# Patient Record
Sex: Male | Born: 1970 | Race: Black or African American | Hispanic: No | Marital: Single | Smoking: Former smoker
Health system: Southern US, Community
[De-identification: ages and names within clinical notes are randomized; demographics above are authoritative.]

## PROBLEM LIST (undated history)

## (undated) DIAGNOSIS — I4891 Unspecified atrial fibrillation: Secondary | ICD-10-CM

## (undated) DIAGNOSIS — R748 Abnormal levels of other serum enzymes: Secondary | ICD-10-CM

## (undated) DIAGNOSIS — F1010 Alcohol abuse, uncomplicated: Secondary | ICD-10-CM

## (undated) DIAGNOSIS — I10 Essential (primary) hypertension: Secondary | ICD-10-CM

## (undated) HISTORY — DX: Essential (primary) hypertension: I10

## (undated) HISTORY — DX: Alcohol abuse, uncomplicated: F10.10

## (undated) HISTORY — PX: SPINE SURGERY: SHX786

## (undated) HISTORY — DX: Abnormal levels of other serum enzymes: R74.8

---

## 2003-02-23 ENCOUNTER — Encounter: Payer: Self-pay | Admitting: Emergency Medicine

## 2003-02-23 ENCOUNTER — Emergency Department (HOSPITAL_COMMUNITY): Admission: EM | Admit: 2003-02-23 | Discharge: 2003-02-23 | Payer: Self-pay | Admitting: *Deleted

## 2013-04-20 ENCOUNTER — Encounter: Payer: Self-pay | Admitting: Physician Assistant

## 2013-04-20 ENCOUNTER — Ambulatory Visit (INDEPENDENT_AMBULATORY_CARE_PROVIDER_SITE_OTHER): Payer: Managed Care, Other (non HMO) | Admitting: Physician Assistant

## 2013-04-20 VITALS — BP 144/94 | HR 88 | Temp 98.5°F | Resp 18 | Ht 70.25 in | Wt 280.0 lb

## 2013-04-20 DIAGNOSIS — I10 Essential (primary) hypertension: Secondary | ICD-10-CM | POA: Insufficient documentation

## 2013-04-20 MED ORDER — BENAZEPRIL HCL 10 MG PO TABS: 10.0000 mg | ORAL_TABLET | Freq: Every day | ORAL | Status: DC

## 2013-04-21 NOTE — Progress Notes (Signed)
   Patient ID: Barry Harris MRN: [id number], DOB: 18-Jul-1971, 42 y.o. Date of Encounter: 04/21/2013, 1:09 PM    Chief Complaint:  Chief Complaint  Patient presents with  . new pt est care  HTN     HPI: 42 y.o. year old AA male presents to establish care at this practice. His father, who passed away with metastaic cancer in the past year, was a patient here. Pt had been going to [facility] at [place]. His LOV there was about 2 years ago. He had been started on BP med-metoprolol 25mg . He has been out of med for > 1 year.  He wants to recheck his BP and start BP med if needed. Is interested in having CPE in future. Has no complaints today. Has been feeling good.   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     Home Meds: See attached medication section for any medications that were entered at today's visit. The computer does not put those onto this list.The following list is a list of meds entered prior to today's visit.   No current outpatient prescriptions on file prior to visit.   No current facility-administered medications on file prior to visit.    Allergies: No Known Allergies    Review of Systems: See HPI for pertinent ROS. All other ROS negative.    Physical Exam: Blood pressure 144/94, pulse 88, temperature 98.5 F (36.9 C), temperature source Oral, resp. rate 18, height 5' 10.25" (1.784 m), weight 280 lb (127.007 kg)., Body mass index is 39.91 kg/(m^2). General:  Overweight AAM. Very Pleasant. Appears in no acute distress. Neck: Supple. No thyromegaly. No lymphadenopathy.No carotid bruit. Lungs: Clear bilaterally to auscultation without wheezes, rales, or rhonchi. Breathing is unlabored. Heart: Regular rhythm. No murmurs, rubs, or gallops. Msk:  Strength and tone normal for age. Extremities/Skin: Warm and dry. No edema. Neuro: Alert and oriented X 3. Moves all extremities spontaneously. Gait is normal. CNII-XII grossly in tact. Psych:  Responds to  questions appropriately with a normal affect.     ASSESSMENT AND PLAN:  42 y.o. year old male with  1. Hypertension Pt says he did have fatigue with metoprolol. Will use ACE Inh instead. Will start med. Will sched f/u OV in about 2 weeks. Will schedule it as CPE in early a.m. So he can come fasting. Can recheck BP and check labs while he is here for that one visit. He say she can go into work late.  - benazepril (LOTENSIN) 10 MG tablet; Take 1 tablet (10 mg total) by mouth daily.  Dispense: 30 tablet; Refill: 0   Signed, [address] [place], [place], [facility] 04/21/2013 1:09 PM

## 2013-05-08 ENCOUNTER — Ambulatory Visit (INDEPENDENT_AMBULATORY_CARE_PROVIDER_SITE_OTHER): Payer: Managed Care, Other (non HMO) | Admitting: Physician Assistant

## 2013-05-08 ENCOUNTER — Encounter: Payer: Self-pay | Admitting: Physician Assistant

## 2013-05-08 VITALS — BP 154/100 | HR 100 | Temp 98.8°F | Resp 20 | Ht 70.5 in | Wt 276.0 lb

## 2013-05-08 DIAGNOSIS — I10 Essential (primary) hypertension: Secondary | ICD-10-CM

## 2013-05-08 DIAGNOSIS — Z0000 Encounter for general adult medical examination without abnormal findings: Secondary | ICD-10-CM

## 2013-05-08 DIAGNOSIS — Z125 Encounter for screening for malignant neoplasm of prostate: Secondary | ICD-10-CM

## 2013-05-08 LAB — COMPLETE METABOLIC PANEL WITH GFR
Albumin: 4.5 g/dL (ref 3.5–5.2)
BUN: 17 mg/dL (ref 6–23)
CO2: 24 mEq/L (ref 19–32)
Calcium: 9.9 mg/dL (ref 8.4–10.5)
Chloride: 100 mEq/L (ref 96–112)
GFR, Est African American: >89 mL/min
GFR, Est Non African American: >89 mL/min
Glucose, Bld: 101 mg/dL — ABNORMAL HIGH (ref 70–99)
Potassium: 4.3 mEq/L (ref 3.5–5.3)

## 2013-05-08 LAB — CBC WITH DIFFERENTIAL/PLATELET
Basophils Relative: 1 % (ref 0–1)
Hemoglobin: 15.9 g/dL (ref 13.0–17.0)
Lymphocytes Relative: 23 % (ref 12–46)
MCHC: 35.3 g/dL (ref 30.0–36.0)
Monocytes Relative: 8 % (ref 3–12)
Neutro Abs: 4.3 10*3/uL (ref 1.7–7.7)
Neutrophils Relative %: 66 % (ref 43–77)
RBC: 5.58 MIL/uL (ref 4.22–5.81)
WBC: 6.5 10*3/uL (ref 4.0–10.5)

## 2013-05-08 LAB — TSH: TSH: 2.061 u[IU]/mL (ref 0.350–4.500)

## 2013-05-08 LAB — LIPID PANEL
Cholesterol: 202 mg/dL — ABNORMAL HIGH (ref 0–200)
VLDL: 58 mg/dL — ABNORMAL HIGH (ref 0–40)

## 2013-05-08 MED ORDER — BENAZEPRIL HCL 20 MG PO TABS: 20.0000 mg | ORAL_TABLET | Freq: Every day | ORAL | Status: DC

## 2013-05-08 MED ORDER — HYDROCHLOROTHIAZIDE 25 MG PO TABS: 25.0000 mg | ORAL_TABLET | Freq: Every day | ORAL | Status: DC

## 2013-05-08 NOTE — Progress Notes (Signed)
Patient ID: Barry Harris MRN: [id number], DOB: [birth year] 42 y.o. Date of Encounter: 05/08/2013, 7:33 PM    Chief Complaint: Physical (CPE)  HPI: 42 y.o. y/o AA male here for CPE. He was seen by me for initial OV to establish care at our office on 04/21/13. His father, Barry Harris, passed away this past year secondary to metastatic cancer-was a  pt here.  When Barry Harris came 04/21/13 he reported h/o HTN but reported he had stopped his medication > 1 year ago. His LOV with his prior PCP was 2 years ago.  At OV 04/21/13 BP was 144/94.  He reported he had experienced fatigue with metoprolol in past. So, I started Benazepril 10mg  instead. He has been taking this daily and has taken dose this morning. He has had no adv effects.   He has no complaints.   He says he weighed 292 lb January 2014. He and friends at work made new years resolution. He has made diet changes and has exercised some-has lost to 276 lb today.    Review of Systems: Consitutional: No fever, chills, fatigue, night sweats, lymphadenopathy, or weight changes. Eyes: No visual changes, eye redness, or discharge. ENT/Mouth: Ears: No otalgia, tinnitus, hearing loss, discharge. Nose: No congestion, rhinorrhea, sinus pain, or epistaxis. Throat: No sore throat, post nasal drip, or teeth pain. Cardiovascular: No CP, palpitations, diaphoresis, DOE, edema, orthopnea, PND. Respiratory: No cough, hemoptysis, SOB, or wheezing. Gastrointestinal: No anorexia, dysphagia, reflux, pain, nausea, vomiting, hematemesis, diarrhea, constipation, BRBPR, or melena. Genitourinary: No dysuria, frequency, urgency, hematuria, incontinence, nocturia, decreased urinary stream, discharge, impotence, or testicular pain/masses. Musculoskeletal: No decreased ROM, myalgias, stiffness, joint swelling, or weakness. Skin: No rash, erythema, lesion changes, pain, warmth, jaundice, or pruritis. Neurological: No headache, dizziness, syncope, seizures,  tremors, memory loss, coordination problems, or paresthesias. Psychological: No anxiety, depression, hallucinations, SI/HI. Endocrine: No fatigue, polydipsia, polyphagia, polyuria, or known diabetes. All other systems were reviewed and are otherwise negative.  Past Medical History  Diagnosis Date  . Hypertension      Past Surgical History  Procedure Laterality Date  . Spine surgery      42yo    Home Meds:  Current Outpatient Prescriptions on File Prior to Visit  Medication Sig Dispense Refill  . Multiple Vitamin (MULTIVITAMIN) tablet Take 1 tablet by mouth daily.       No current facility-administered medications on file prior to visit.    Allergies: No Known Allergies  History   Social History  . Marital Status: Single    Spouse Name: N/A    Number of Children: N/A  . Years of Education: N/A   Occupational History  . [employer]   Social History Main Topics  . Smoking status: Former Smoker    Quit date: 04/20/2001  . Smokeless tobacco: Never Used  . Alcohol Use: Not on file  . Drug Use: No  . Sexually Active: Not Currently   Other Topics Concern  . 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       Exercise: Lifts weights some. Rides stationary bike 2-3 times per week -but does this in spurts.                    Does not consistently exercise and stay on routine.      Diet: Fairly careful with what he eats.  Family History  Problem Relation Age of Onset  . Cancer Father     prostate  . Asthma Sister   . Diabetes Sister   . Diabetes Maternal Grandmother   . Heart disease Maternal Grandmother   . Diabetes Mother   . Hypertension Mother     Physical Exam: Blood pressure 154/100, pulse 100, temperature 98.8 F (37.1 C), temperature source Oral, resp. rate 20, height 5' 10.5" (1.791 m), weight 276 lb (125.193 kg).  BP by Me: 170/110 loud and clear on  left. General: Overweight AAM. Appears in no acute distress. HEENT: Normocephalic, atraumatic. Conjunctiva pink, sclera non-icteric. Pupils 2 mm constricting to 1 mm, round, regular, and equally reactive to light and accomodation. EOMI. Internal auditory canal clear. TMs with good cone of light and without pathology. Nasal mucosa pink. Nares are without discharge. No sinus tenderness. Oral mucosa pink. Pharynx without exudate.   Neck: Supple. Trachea midline. No thyromegaly. Full ROM. No lymphadenopathy.No carotid Bruits.  Lungs: Clear to auscultation bilaterally without wheezes, rales, or rhonchi. Breathing is of normal effort and unlabored. Cardiovascular: RRR with S1 S2. No murmurs, rubs, or gallops. Distal pulses 2+ symmetrically. No carotid or abdominal bruits. No Renal Artery Bruits. Abdomen: Soft, non-tender, non-distended with normoactive bowel sounds. No hepatosplenomegaly or masses. No rebound/guarding. No CVA tenderness. No hernias. Rectal: No external hemorrhoids or fissures. Rectal vault without masses. Prostate gland firm and smooth. No nodularity, tenderness, mass, or induration.  Musculoskeletal: Full range of motion and 5/5 strength throughout. Without swelling, atrophy, tenderness, crepitus, or warmth. Extremities without clubbing, cyanosis, or edema. Calves supple. Skin: Warm and moist without erythema, ecchymosis, wounds, or rash. Neuro: A+Ox3. CN II-XII grossly intact. Moves all extremities spontaneously. Full sensation throughout. Normal gait. DTR 2+ throughout upper and lower extremities. Finger to nose intact. Psych:  Responds to questions appropriately with a normal affect.   Assessment/Plan:  42 y.o. y/o AA male here for CPE -1. Visit for preventive health examination Screening Labs: Pt is fasting: - COMPLETE METABOLIC PANEL WITH GFR - Lipid panel - PSA - TSH - Vitamin D 25 hydroxy - CBC with Differential  2. Hypertension Uncontrolled. Increase Benazepril to 20mg .  Add HCTZ. RTC to recheck BP and BMET in 2 weeks. - benazepril (LOTENSIN) 20 MG tablet; Take 1 tablet (20 mg total) by mouth daily.  Dispense: 30 tablet; Refill: 0 - hydrochlorothiazide (HYDRODIURIL) 25 MG tablet; Take 1 tablet (25 mg total) by mouth daily.  Dispense: 30 tablet; Refill: 0  3. Prostate cancer screening - PSA  4. Immunizations: Tetanus      Pt reports he knows he has had in <10 years.  5. Weight Management: Congrats on the weight loss!! Cont the diet and exercise forever!!! Will f/u , monitor.   Signed:   [address] [place], [place]  05/08/2013 7:33 PM

## 2013-05-09 LAB — VITAMIN D 25 HYDROXY (VIT D DEFICIENCY, FRACTURES): Vit D, 25-Hydroxy: 54 ng/mL (ref 30–89)

## 2013-05-22 ENCOUNTER — Ambulatory Visit: Payer: Managed Care, Other (non HMO) | Admitting: Physician Assistant

## 2013-05-25 ENCOUNTER — Ambulatory Visit: Payer: Managed Care, Other (non HMO) | Admitting: Physician Assistant

## 2013-05-31 ENCOUNTER — Ambulatory Visit: Payer: Managed Care, Other (non HMO) | Admitting: Physician Assistant

## 2013-08-17 ENCOUNTER — Other Ambulatory Visit: Payer: Self-pay

## 2013-11-09 ENCOUNTER — Other Ambulatory Visit: Payer: Self-pay | Admitting: Physician Assistant

## 2013-11-09 DIAGNOSIS — I10 Essential (primary) hypertension: Secondary | ICD-10-CM

## 2013-11-09 MED ORDER — HYDROCHLOROTHIAZIDE 25 MG PO TABS: 25.0000 mg | ORAL_TABLET | Freq: Every day | ORAL | Status: DC

## 2013-11-09 MED ORDER — BENAZEPRIL HCL 20 MG PO TABS: 20.0000 mg | ORAL_TABLET | Freq: Every day | ORAL | Status: DC

## 2013-11-13 ENCOUNTER — Ambulatory Visit: Payer: Managed Care, Other (non HMO) | Admitting: Physician Assistant

## 2013-11-23 ENCOUNTER — Ambulatory Visit: Payer: Managed Care, Other (non HMO) | Admitting: Physician Assistant

## 2014-01-22 ENCOUNTER — Other Ambulatory Visit: Payer: BC Managed Care – PPO

## 2014-01-22 DIAGNOSIS — Z79899 Other long term (current) drug therapy: Secondary | ICD-10-CM

## 2014-01-22 DIAGNOSIS — Z0000 Encounter for general adult medical examination without abnormal findings: Secondary | ICD-10-CM

## 2014-01-22 LAB — LIPID PANEL
Cholesterol: 196 mg/dL (ref 0–200)
HDL: 68 mg/dL (ref >39)
LDL Cholesterol: 99 mg/dL (ref 0–99)
Total CHOL/HDL Ratio: 2.9 Ratio
Triglycerides: 147 mg/dL (ref <150)
VLDL: 29 mg/dL (ref 0–40)

## 2014-01-22 LAB — COMPLETE METABOLIC PANEL WITH GFR
ALBUMIN: 4.2 g/dL (ref 3.5–5.2)
ALT: 24 U/L (ref 0–53)
AST: 26 U/L (ref 0–37)
Alkaline Phosphatase: 56 U/L (ref 39–117)
BUN: 17 mg/dL (ref 6–23)
CALCIUM: 9.6 mg/dL (ref 8.4–10.5)
CHLORIDE: 103 meq/L (ref 96–112)
CO2: 23 mEq/L (ref 19–32)
Creat: 0.82 mg/dL (ref 0.50–1.35)
GFR, Est African American: >89 mL/min
GFR, Est Non African American: >89 mL/min
Glucose, Bld: 101 mg/dL — ABNORMAL HIGH (ref 70–99)
POTASSIUM: 4.3 meq/L (ref 3.5–5.3)
SODIUM: 139 meq/L (ref 135–145)
TOTAL PROTEIN: 6.9 g/dL (ref 6.0–8.3)
Total Bilirubin: 0.7 mg/dL (ref 0.2–1.2)

## 2014-01-22 LAB — CBC WITH DIFFERENTIAL/PLATELET
Basophils Absolute: 0.1 10*3/uL (ref 0.0–0.1)
Basophils Relative: 1 % (ref 0–1)
Eosinophils Absolute: 0.1 10*3/uL (ref 0.0–0.7)
Eosinophils Relative: 1 % (ref 0–5)
HEMATOCRIT: 44 % (ref 39.0–52.0)
HEMOGLOBIN: 15.1 g/dL (ref 13.0–17.0)
LYMPHS ABS: 1.5 10*3/uL (ref 0.7–4.0)
LYMPHS PCT: 24 % (ref 12–46)
MCH: 29.2 pg (ref 26.0–34.0)
MCHC: 34.3 g/dL (ref 30.0–36.0)
MCV: 85.1 fL (ref 78.0–100.0)
MONO ABS: 0.5 10*3/uL (ref 0.1–1.0)
Monocytes Relative: 8 % (ref 3–12)
NEUTROS ABS: 4.2 10*3/uL (ref 1.7–7.7)
Neutrophils Relative %: 66 % (ref 43–77)
Platelets: 203 10*3/uL (ref 150–400)
RBC: 5.17 MIL/uL (ref 4.22–5.81)
RDW: 15.7 % — ABNORMAL HIGH (ref 11.5–15.5)
WBC: 6.4 10*3/uL (ref 4.0–10.5)

## 2014-01-23 LAB — VITAMIN D 25 HYDROXY (VIT D DEFICIENCY, FRACTURES): Vit D, 25-Hydroxy: 51 ng/mL (ref 30–89)

## 2014-01-24 ENCOUNTER — Ambulatory Visit (INDEPENDENT_AMBULATORY_CARE_PROVIDER_SITE_OTHER): Payer: BC Managed Care – PPO | Admitting: Physician Assistant

## 2014-01-24 ENCOUNTER — Encounter: Payer: Self-pay | Admitting: Physician Assistant

## 2014-01-24 VITALS — BP 152/108 | HR 108 | Temp 97.2°F | Resp 18 | Ht 71.25 in | Wt 284.0 lb

## 2014-01-24 DIAGNOSIS — I10 Essential (primary) hypertension: Secondary | ICD-10-CM

## 2014-01-24 MED ORDER — HYDROCHLOROTHIAZIDE 25 MG PO TABS: 25.0000 mg | ORAL_TABLET | Freq: Every day | ORAL | Status: DC

## 2014-01-24 MED ORDER — BENAZEPRIL HCL 20 MG PO TABS: 20.0000 mg | ORAL_TABLET | Freq: Every day | ORAL | Status: DC

## 2014-01-24 NOTE — Progress Notes (Signed)
    Patient ID: Alfredo MartinezShaunte L Delis MRN: [id number], DOB: 08/06/1971, 43 y.o. Date of Encounter: 01/24/2014, 9:41 AM    Chief Complaint:  Chief Complaint  Patient presents with  . Hypertension     HPI: 43 y.o. year old AA male --originally on my schedule as a complete physical exam but then I noted that his last physical exam was 05/08/13. Discussed this with him and the fact that his insurance may not cover to have a physical until after 05/08/14 Patient then says that " yeah-- he better wait and make sure and hold off on doing a physical."  Therefore,  today we changed this to just a followup visit for his hypertension. At his last visit with me 05/08/13 his blood pressure was suboptimal on benazepril 20 mg. Therefore at that time I added HCTZ 25 mg. Says he did take both of those medications for a while and was having no adverse effects. He says that he just didn't make an effort to get back in here for followup so the refills ran out and he has been off of the medication. He has no complaints. Having no angina symptoms.  We discussed that at his last visit he had lost weight with diet and exercise changes. Today he reports that unfortunately he got off track with this and has gained some of the weight back.    Home Meds: See attached medication section for any medications that were entered at today's visit. The computer does not put those onto this list.The following list is a list of meds entered prior to today's visit.   Current Outpatient Prescriptions on File Prior to Visit  Medication Sig Dispense Refill  . Multiple Vitamin (MULTIVITAMIN) tablet Take 1 tablet by mouth daily.       No current facility-administered medications on file prior to visit.    Allergies: No Known Allergies    Review of Systems: See HPI for pertinent ROS. All other ROS negative.    Physical Exam: Blood pressure 152/108, pulse 108, temperature 97.2 F (36.2 C), temperature source Oral, resp. rate  18, height 5' 11.25" (1.81 m), weight 284 lb (128.822 kg)., Body mass index is 39.32 kg/(m^2). General:  Obese African American male . Appears in no acute distress. Neck: Supple. No thyromegaly. No lymphadenopathy. No carotid bruits. Lungs: Clear bilaterally to auscultation without wheezes, rales, or rhonchi. Breathing is unlabored. Heart: Regular rhythm. No murmurs, rubs, or gallops. Msk:  Strength and tone normal for age. Extremities/Skin: Warm and dry.  No edema.  Neuro: Alert and oriented X 3. Moves all extremities spontaneously. Gait is normal. CNII-XII grossly in tact. Psych:  Responds to questions appropriately with a normal affect.     ASSESSMENT AND PLAN:  43 y.o. year old male with  1. Hypertension He is to restart blood pressure medications. He is to schedule followup office visit here in 2 weeks we can recheck blood pressure and lab work on medication. - hydrochlorothiazide (HYDRODIURIL) 25 MG tablet; Take 1 tablet (25 mg total) by mouth daily.  Dispense: 30 tablet; Refill: 0 - benazepril (LOTENSIN) 20 MG tablet; Take 1 tablet (20 mg total) by mouth daily.  Dispense: 30 tablet; Refill: 0  2. Obesity-- Discussed his prior weights with him. Discussed getting back on track with his appropriate diet and exercise.  Signed, [address] Beth [place], [place], [facility] 01/24/2014 9:41 AM

## 2014-02-07 ENCOUNTER — Ambulatory Visit: Payer: BC Managed Care – PPO | Admitting: Physician Assistant

## 2014-02-12 ENCOUNTER — Ambulatory Visit: Payer: BC Managed Care – PPO | Admitting: Physician Assistant

## 2014-03-16 ENCOUNTER — Ambulatory Visit: Payer: BC Managed Care – PPO | Admitting: Family Medicine

## 2014-05-08 ENCOUNTER — Ambulatory Visit (INDEPENDENT_AMBULATORY_CARE_PROVIDER_SITE_OTHER): Payer: BC Managed Care – PPO | Admitting: Family Medicine

## 2014-05-08 ENCOUNTER — Encounter: Payer: Self-pay | Admitting: Family Medicine

## 2014-05-08 VITALS — BP 168/110 | HR 88 | Temp 98.5°F | Resp 16 | Ht 71.5 in | Wt 288.0 lb

## 2014-05-08 DIAGNOSIS — E669 Obesity, unspecified: Secondary | ICD-10-CM

## 2014-05-08 DIAGNOSIS — I10 Essential (primary) hypertension: Secondary | ICD-10-CM

## 2014-05-08 MED ORDER — AMLODIPINE BESYLATE 10 MG PO TABS: 10.0000 mg | ORAL_TABLET | Freq: Every day | ORAL | Status: DC

## 2014-05-08 MED ORDER — BENAZEPRIL HCL 20 MG PO TABS: 20.0000 mg | ORAL_TABLET | Freq: Every day | ORAL | Status: DC

## 2014-05-08 NOTE — Progress Notes (Signed)
Patient ID: Barry Harris, male   DOB: 07/13/1971, 43 y.o.   MRN: [id number]   Subjective:    Patient ID: Barry Harris, male    DOB: 08/15/1971, 43 y.o.   MRN: [id number]  Patient presents for Blood Pressure Check and Medication review/ refill  patient here to followup hypertension. Unfortunately does not follow through with his care for his blood pressure. He was last seen in April 20/15 at that time he was restarted on hydrochlorothiazide 25 mg of benazepril 20 mg he had 30 days worth of medication but he did not followup for his two-week visit. His labs were reviewed he had fasting labs done at that visit as well which were all normal. He denies any headache shortness of breath dizzy episodes. He has been checking his blood pressure at home and has been consistently in the 160s over 110s. He did state when he was on this medication for a couple weeks his blood pressure came down to 140s over 90.  She's very anxious and also bases he does not like doctors offices of being closed in    Review Of Systems:  GEN- denies fatigue, fever, weight loss,weakness, recent illness HEENT- denies eye drainage, change in vision, nasal discharge, CVS- denies chest pain, palpitations RESP- denies SOB, cough, wheeze ABD- denies N/V, change in stools, abd painy Neuro- denies headache, dizziness, syncope, seizure activity       Objective:    BP 168/110  Pulse 88  Temp(Src) 98.5 F (36.9 C) (Oral)  Resp 16  Ht 5' 11.5" (1.816 m)  Wt 288 lb (130.636 kg)  BMI 39.61 kg/m2 GEN- NAD, alert and oriented x3 HEENT- PERRL, EOMI, non injected sclera, pink conjunctiva, MMM, oropharynx clear CVS- mild tachycardia HR 96, no murmur RESP-CTAB EXT- No edema Pulses- Radial 2+        Assessment & Plan:      Problem List Items Addressed This Visit   Hypertension - Primary   Relevant Medications      amLODIpine (NORVASC) tablet      benazepril (LOTENSIN) tablet      Note: This dictation was  prepared with Dragon dictation along with smaller phrase technology. Any transcriptional errors that result from this process are unintentional.

## 2014-05-08 NOTE — Patient Instructions (Signed)
Start blood pressure medications F/U 4 weeks

## 2014-05-08 NOTE — Assessment & Plan Note (Signed)
Restart benezepril once a day Add norvasc 10mg  D/C HCTZ due to side effects Reviewed labs wnl F/U 4 weeks

## 2014-06-01 ENCOUNTER — Ambulatory Visit (INDEPENDENT_AMBULATORY_CARE_PROVIDER_SITE_OTHER): Payer: BC Managed Care – PPO | Admitting: Family Medicine

## 2014-06-01 VITALS — BP 136/92 | HR 110 | Temp 98.3°F | Resp 16 | Ht 71.0 in | Wt 282.8 lb

## 2014-06-01 DIAGNOSIS — R197 Diarrhea, unspecified: Secondary | ICD-10-CM

## 2014-06-01 NOTE — Progress Notes (Signed)
43yo worker at [employer]  He has had resolving diarrhea over 3 days.  No cramps or fever, no blood in stool.  He ate some fish prior to the onset of illness  He has been taking immodium, and probiotics.  Objective: Articulate and in no acute distress HEENT: Unremarkable with well-hydrated mucous membranes Chest: Clear Heart: Regular without tachycardia, no murmur Abdomen: Soft nontender with active bowel sounds, no guarding or rebound, no HSM or masses Skin: Warm and dry Gait: Stable  Assessment: Mild diarrhea that is now resolving  Plan: Continue the probiotics for another week, return to work on Monday  Signed, Kenyon AnaKurt Bianca Raneri

## 2014-06-01 NOTE — Patient Instructions (Signed)

## 2014-06-12 ENCOUNTER — Ambulatory Visit (INDEPENDENT_AMBULATORY_CARE_PROVIDER_SITE_OTHER): Payer: BC Managed Care – PPO | Admitting: Family Medicine

## 2014-06-12 ENCOUNTER — Encounter: Payer: Self-pay | Admitting: Family Medicine

## 2014-06-12 VITALS — BP 160/98 | HR 78 | Temp 98.3°F | Resp 16 | Ht 71.0 in | Wt 283.0 lb

## 2014-06-12 DIAGNOSIS — I10 Essential (primary) hypertension: Secondary | ICD-10-CM

## 2014-06-12 DIAGNOSIS — F411 Generalized anxiety disorder: Secondary | ICD-10-CM | POA: Insufficient documentation

## 2014-06-12 MED ORDER — BENAZEPRIL HCL 40 MG PO TABS: 40.0000 mg | ORAL_TABLET | Freq: Every day | ORAL | Status: DC

## 2014-06-12 MED ORDER — BUSPIRONE HCL 7.5 MG PO TABS: 7.5000 mg | ORAL_TABLET | Freq: Two times a day (BID) | ORAL | Status: DC

## 2014-06-12 NOTE — Assessment & Plan Note (Signed)
We discussed medications. He does not want to be on anything that might make him somnolent. She does not have any depressive symptoms on the try him on BuSpar for his anxiety he will start with 7.5 mg twice a day will followup in 4 weeks

## 2014-06-12 NOTE — Progress Notes (Signed)
Patient ID: Barry Harris, male   DOB: 1971-05-22, 43 y.o.   MRN: [id number]   Subjective:    Patient ID: Barry Harris, male    DOB: Sep 19, 1971, 43 y.o.   MRN: [id number]  Patient presents for 4 week F/U, HTN and Increased Sweating  patient here to followup blood pressure. He actually stopped the Norvasc as he thinks he may have been causing some heart racing but then he states that he is always anxious all the time he has been like this his entire adult life he gets this way almost daily at  work or in the doctor's office . He doesn't like crows of people or feeling closed in, he always feels like he is on edge. He states that his sister also suffers with anxiety as well. He's never been on any medications. At times he feels himself getting very anxious he gets palpitations and he starts to sweat. He denies any feelings of depression. He denies any insomnia.  Note he did have an interim visit at the urgent care secondary to gastroenteritis which is now resolved  His home blood pressure readings are still in the 160s over 100    Review Of Systems:  GEN- denies fatigue, fever, weight loss,weakness, recent illness HEENT- denies eye drainage, change in vision, nasal discharge, CVS- denies chest pain, palpitations RESP- denies SOB, cough, wheeze Neuro- denies headache, dizziness, syncope, seizure activity       Objective:    BP 160/98  Pulse 78  Temp(Src) 98.3 F (36.8 C) (Oral)  Resp 16  Ht  (1.803 m)  Wt 283 lb (128.368 kg)  BMI 39.49 kg/m2 GEN- NAD, alert and oriented x3 CVS- RRR, no murmur RESP-CTAB PSYCH- Anxious appearing, not depressed, well groomed, normal speech, no hallucinations EXT- No edema Pulses- Radial 2+        Assessment & Plan:      Problem List Items Addressed This Visit   Hypertension - Primary   Relevant Medications      benazepril (LOTENSIN) tablet   GAD (generalized anxiety disorder)      Note: This dictation was prepared  with Dragon dictation along with smaller phrase technology. Any transcriptional errors that result from this process are unintentional.

## 2014-06-12 NOTE — Assessment & Plan Note (Addendum)
I think he was possibly having some anxiety from taking a new medication at any rate I will have him increase his benazepril to 40 mg for the next week and we'll see what his home readings are as he tends to be more calm her at home. Her next step will be to add a beta blocker which will also help some of his anxiety I will plan to recheck his renal function at the next visit since I increased his ACE inhibitor

## 2014-06-12 NOTE — Patient Instructions (Signed)
Take 2 of the benzapril - total  Start buspar 1 tablet twice a day for anxiety  We will f/u by phone in 1 week F/U in office 4 weeks

## 2014-06-22 ENCOUNTER — Telehealth: Payer: Self-pay | Admitting: Family Medicine

## 2014-06-22 NOTE — Telephone Encounter (Signed)
Message copied by Honor Loh on Fri Jun 22, 2014  8:13 AM ------      Message from: Milinda Antis F      Created: Wed Jun 20, 2014  4:59 PM      Regarding: FW: F/U Home BP         Please call and check on blood pressure his medication was increased last visit      ----- Message -----         From: Salley Scarlet, MD         Sent: 06/19/2014           To: Salley Scarlet, MD      Subject: F/U Home BP                                                     ------

## 2014-06-22 NOTE — Telephone Encounter (Signed)
He is suppose to be taking benazepril  once a day , I stopped norvasc because he states it caused SE It should not cause any cramping This is likley more muscles cramps, increase water intake

## 2014-06-22 NOTE — Telephone Encounter (Signed)
Patient notified and voiced understanding. He will no longer take the Norvasc, he was taking both medications. He will drink more water.

## 2014-06-22 NOTE — Telephone Encounter (Signed)
Patient states his BP has been running around 140/90, he is getting a side pain-in the right side. He states its like a cramp, he wants to know if it is coming from the medication. He is taking Benazepril.He states he is also taking Amlodipine, I do not see a record of thes medication.

## 2014-06-22 NOTE — Telephone Encounter (Signed)
LMOM for patient to CB. 

## 2014-06-25 ENCOUNTER — Telehealth: Payer: Self-pay | Admitting: Family Medicine

## 2014-06-25 NOTE — Telephone Encounter (Signed)
Message copied by Salley Scarlet on Mon Jun 25, 2014  5:07 PM ------      Message from: Harrold Donath H      Created: Mon Jun 25, 2014  2:51 PM      Regarding: RE: F/U Home BP       Multiple phone calls placed to patient with no answer and no return call.             ----- Message -----         From: Salley Scarlet, MD         Sent: 06/20/2014   4:59 PM           To: Durwin Nora Six, LPN      Subject: FW: F/U Home BP                                            Please call and check on blood pressure his medication was increased last visit      ----- Message -----         From: Salley Scarlet, MD         Sent: 06/19/2014           To: Salley Scarlet, MD      Subject: F/U Home BP                                                           ------

## 2014-06-28 ENCOUNTER — Telehealth: Payer: Self-pay

## 2014-06-28 NOTE — Telephone Encounter (Signed)
Just an FYI

## 2014-06-28 NOTE — Telephone Encounter (Signed)
Pt dropped off FMLA ppw 06/26/14 for Dr. Elbert Ewings to complete in 5-7 business days. Please return to Disability box by checkout at 102 so Jasmine or myself can scan Complete ppw into MR, and fax to [phone]

## 2014-07-03 DIAGNOSIS — Z0271 Encounter for disability determination: Secondary | ICD-10-CM

## 2014-07-03 NOTE — Telephone Encounter (Signed)
Pt's fmla ppw faxed

## 2014-07-10 ENCOUNTER — Ambulatory Visit: Payer: BC Managed Care – PPO | Admitting: Family Medicine

## 2014-08-03 ENCOUNTER — Ambulatory Visit: Payer: BC Managed Care – PPO | Admitting: Family Medicine

## 2014-08-10 ENCOUNTER — Encounter: Payer: Self-pay | Admitting: Family Medicine

## 2014-10-14 ENCOUNTER — Other Ambulatory Visit: Payer: Self-pay | Admitting: Family Medicine

## 2014-10-15 NOTE — Telephone Encounter (Signed)
Medication filled x1 with no refills.   Requires office visit before any further refills can be given.   Letter sent.  

## 2014-11-05 ENCOUNTER — Encounter: Payer: Self-pay | Admitting: Physician Assistant

## 2014-11-15 ENCOUNTER — Other Ambulatory Visit: Payer: Self-pay | Admitting: Physician Assistant

## 2014-11-15 NOTE — Telephone Encounter (Signed)
Medication filled x1 with no refills.   Requires office visit before any further refills can be given.   Letter sent.  

## 2014-11-27 ENCOUNTER — Ambulatory Visit (INDEPENDENT_AMBULATORY_CARE_PROVIDER_SITE_OTHER): Payer: BLUE CROSS/BLUE SHIELD | Admitting: Family Medicine

## 2014-11-27 ENCOUNTER — Encounter: Payer: Self-pay | Admitting: Family Medicine

## 2014-11-27 VITALS — BP 150/102 | HR 98 | Temp 98.8°F | Resp 16 | Ht 71.0 in | Wt 288.0 lb

## 2014-11-27 DIAGNOSIS — F411 Generalized anxiety disorder: Secondary | ICD-10-CM

## 2014-11-27 DIAGNOSIS — J069 Acute upper respiratory infection, unspecified: Secondary | ICD-10-CM

## 2014-11-27 DIAGNOSIS — I10 Essential (primary) hypertension: Secondary | ICD-10-CM

## 2014-11-27 DIAGNOSIS — E669 Obesity, unspecified: Secondary | ICD-10-CM

## 2014-11-27 MED ORDER — METOPROLOL SUCCINATE ER 25 MG PO TB24: 25.0000 mg | ORAL_TABLET | Freq: Every day | ORAL | Status: DC

## 2014-11-27 MED ORDER — BENAZEPRIL HCL 40 MG PO TABS: 40.0000 mg | ORAL_TABLET | Freq: Every day | ORAL | Status: DC

## 2014-11-27 NOTE — Progress Notes (Signed)
Patient ID: Barry Harris, male   DOB: 11/13/1970, 44 y.o.   MRN: [id number]   Subjective:    Patient ID: Barry Harris, male    DOB: 11/22/1970, 44 y.o.   MRN: [id number]  Patient presents for F/U HTN  Patient here to follow-up hypertension. Unfortunately he has not followed up in the past 5 months. He has history of uncontrolled blood pressure. He is currently taking benazepril 40 mg without any side effects. His blood pressure at home still runs 150s over 90s to 100s.  He does complain of a head cold he's had some sinus pressure and drainage headache mild cough for the past 3-4 days. He has not had any fever or chills. He did take some over-the-counter medicine. \ Note he is not taking the anxiety medication, though stated he needs it  Review Of Systems:  GEN- denies fatigue, fever, weight loss,weakness, recent illness HEENT- denies eye drainage, change in vision,+ nasal discharge, CVS- denies chest pain, palpitations RESP- denies SOB, +cough, wheeze ABD- denies N/V, change in stools, abd pain GU- denies dysuria, hematuria, dribbling, incontinence MSK- denies joint pain, muscle aches, injury Neuro- denies headache, dizziness, syncope, seizure activity       Objective:    BP 150/102 mmHg  Pulse 98  Temp(Src) 98.8 F (37.1 C) (Oral)  Resp 16  Ht 5\' 11"  (1.803 m)  Wt 288 lb (130.636 kg)  BMI 40.19 kg/m2 GEN- NAD, alert and oriented x3,fatigued appearing HEENT- PERRL, EOMI, non injected sclera, pink conjunctiva, MMM, oropharynx mild injection, TM clear bilat no effusion,  No  maxillary sinus tenderness, inflammed turbinates,  Clear Nasal drainage  Neck- Supple, no LAD CVS- RRR, no murmur RESP-CTAB Pysch- a little anxious appearing, no SI, good eye contact, no depressed, well groomed EXT- No edema Pulses- Radial 2+          Assessment & Plan:      Problem List Items Addressed This Visit      Unprioritized   Obesity   Hypertension   Relevant  Medications   benazepril (LOTENSIN) tablet   metoprolol succinate (TOPROL-XL) 24 hr tablet    Other Visit Diagnoses    Viral URI    -  Primary    Viral URI, Coricidan OTC, netty pot, supportive care, given note for work       Note: This dictation was prepared with Office managerDragon dictation along with smaller Lobbyistphrase technology. Any transcriptional errors that result from this process are unintentional.

## 2014-11-27 NOTE — Assessment & Plan Note (Signed)
Add Toprol 25mg  once a day to the Lotensin 40mg  Plan for fasting labs when he returns

## 2014-11-27 NOTE — Assessment & Plan Note (Signed)
He would benefit from anxiety meds but declines, will see if the beta blocker helps some

## 2014-11-27 NOTE — Patient Instructions (Signed)
Coricidan over the counter  netty pot for sinuses  Plenty of fluids and rest  Take Benazpril Toprol once a day F/U 4 weeks

## 2014-11-30 ENCOUNTER — Inpatient Hospital Stay (HOSPITAL_COMMUNITY)
Admission: EM | Admit: 2014-11-30 | Discharge: 2014-12-01 | DRG: 642 | Disposition: A | Discharge status: Home / Self Care | Payer: BLUE CROSS/BLUE SHIELD | Attending: Internal Medicine | Admitting: Internal Medicine

## 2014-11-30 ENCOUNTER — Inpatient Hospital Stay (HOSPITAL_COMMUNITY): Payer: BLUE CROSS/BLUE SHIELD

## 2014-11-30 ENCOUNTER — Encounter (HOSPITAL_COMMUNITY): Payer: Self-pay | Admitting: Emergency Medicine

## 2014-11-30 ENCOUNTER — Emergency Department (HOSPITAL_COMMUNITY): Payer: BLUE CROSS/BLUE SHIELD

## 2014-11-30 DIAGNOSIS — R740 Nonspecific elevation of levels of transaminase and lactic acid dehydrogenase [LDH]: Secondary | ICD-10-CM

## 2014-11-30 DIAGNOSIS — E860 Dehydration: Secondary | ICD-10-CM | POA: Diagnosis present

## 2014-11-30 DIAGNOSIS — I10 Essential (primary) hypertension: Secondary | ICD-10-CM | POA: Diagnosis present

## 2014-11-30 DIAGNOSIS — Z8249 Family history of ischemic heart disease and other diseases of the circulatory system: Secondary | ICD-10-CM | POA: Diagnosis not present

## 2014-11-30 DIAGNOSIS — F1010 Alcohol abuse, uncomplicated: Secondary | ICD-10-CM | POA: Diagnosis present

## 2014-11-30 DIAGNOSIS — E781 Pure hyperglyceridemia: Secondary | ICD-10-CM | POA: Diagnosis not present

## 2014-11-30 DIAGNOSIS — R002 Palpitations: Secondary | ICD-10-CM | POA: Diagnosis present

## 2014-11-30 DIAGNOSIS — Z8042 Family history of malignant neoplasm of prostate: Secondary | ICD-10-CM | POA: Diagnosis not present

## 2014-11-30 DIAGNOSIS — Z79899 Other long term (current) drug therapy: Secondary | ICD-10-CM

## 2014-11-30 DIAGNOSIS — Z833 Family history of diabetes mellitus: Secondary | ICD-10-CM

## 2014-11-30 DIAGNOSIS — E871 Hypo-osmolality and hyponatremia: Secondary | ICD-10-CM | POA: Diagnosis present

## 2014-11-30 DIAGNOSIS — F411 Generalized anxiety disorder: Secondary | ICD-10-CM | POA: Diagnosis present

## 2014-11-30 DIAGNOSIS — R7401 Elevation of levels of liver transaminase levels: Secondary | ICD-10-CM | POA: Diagnosis present

## 2014-11-30 DIAGNOSIS — Z87891 Personal history of nicotine dependence: Secondary | ICD-10-CM

## 2014-11-30 DIAGNOSIS — Z825 Family history of asthma and other chronic lower respiratory diseases: Secondary | ICD-10-CM

## 2014-11-30 LAB — CBC WITH DIFFERENTIAL/PLATELET
BASOS ABS: 0 10*3/uL (ref 0.0–0.1)
BASOS PCT: 1 % (ref 0–1)
Eosinophils Absolute: 0 10*3/uL (ref 0.0–0.7)
Eosinophils Relative: 1 % (ref 0–5)
HCT: 40.4 % (ref 39.0–52.0)
HEMOGLOBIN: 15.1 g/dL (ref 13.0–17.0)
LYMPHS ABS: 1 10*3/uL (ref 0.7–4.0)
Lymphocytes Relative: 24 % (ref 12–46)
MCH: 29.7 pg (ref 26.0–34.0)
MCHC: >37 g/dL — ABNORMAL HIGH (ref 30.0–36.0)
MCV: 79.4 fL (ref 78.0–100.0)
MONOS PCT: 14 % — AB (ref 3–12)
Monocytes Absolute: 0.6 10*3/uL (ref 0.1–1.0)
NEUTROS PCT: 60 % (ref 43–77)
Neutro Abs: 2.5 10*3/uL (ref 1.7–7.7)
PLATELETS: 268 10*3/uL (ref 150–400)
RBC: 5.09 MIL/uL (ref 4.22–5.81)
RDW: 13.8 % (ref 11.5–15.5)
WBC: 4.1 10*3/uL (ref 4.0–10.5)

## 2014-11-30 LAB — COMPREHENSIVE METABOLIC PANEL
ALBUMIN: 3.2 g/dL — AB (ref 3.5–5.2)
ALK PHOS: 115 U/L (ref 39–117)
ALT: 922 U/L — ABNORMAL HIGH (ref 0–53)
AST: 818 U/L — AB (ref 0–37)
Anion gap: 8 (ref 5–15)
BILIRUBIN TOTAL: 8.9 mg/dL — AB (ref 0.3–1.2)
BUN: 10 mg/dL (ref 6–23)
CHLORIDE: 100 mmol/L (ref 96–112)
CO2: 23 mmol/L (ref 19–32)
CREATININE: 0.92 mg/dL (ref 0.50–1.35)
Calcium: 7.7 mg/dL — ABNORMAL LOW (ref 8.4–10.5)
GFR calc Af Amer: >90 mL/min (ref >90)
Glucose, Bld: 111 mg/dL — ABNORMAL HIGH (ref 70–99)
Sodium: 131 mmol/L — ABNORMAL LOW (ref 135–145)
Total Protein: UNDETERMINED g/dL (ref 6.0–8.3)

## 2014-11-30 LAB — LIPASE, BLOOD
Lipase: 44 U/L (ref 11–59)
Lipase: 74 U/L — ABNORMAL HIGH (ref 11–59)

## 2014-11-30 LAB — I-STAT CHEM 8, ED
BUN: 14 mg/dL (ref 6–23)
Calcium, Ion: 0.93 mmol/L — ABNORMAL LOW (ref 1.12–1.23)
Chloride: 105 mmol/L (ref 96–112)
Creatinine, Ser: 0.8 mg/dL (ref 0.50–1.35)
Glucose, Bld: 115 mg/dL — ABNORMAL HIGH (ref 70–99)
HCT: 52 % (ref 39.0–52.0)
HEMOGLOBIN: 17.7 g/dL — AB (ref 13.0–17.0)
POTASSIUM: 4.9 mmol/L (ref 3.5–5.1)
SODIUM: 131 mmol/L — AB (ref 135–145)
TCO2: 25 mmol/L (ref 0–100)

## 2014-11-30 LAB — ACETAMINOPHEN LEVEL

## 2014-11-30 LAB — CBG MONITORING, ED: GLUCOSE-CAPILLARY: 123 mg/dL — AB (ref 70–99)

## 2014-11-30 LAB — RAPID URINE DRUG SCREEN, HOSP PERFORMED
AMPHETAMINES: NOT DETECTED
BARBITURATES: NOT DETECTED
Benzodiazepines: NOT DETECTED
Cocaine: NOT DETECTED
Opiates: NOT DETECTED
Tetrahydrocannabinol: NOT DETECTED

## 2014-11-30 LAB — LACTATE DEHYDROGENASE: LDH: 1746 U/L — ABNORMAL HIGH (ref 94–250)

## 2014-11-30 LAB — TSH: TSH: 2.269 u[IU]/mL (ref 0.350–4.500)

## 2014-11-30 LAB — LIPID PANEL
CHOLESTEROL: 219 mg/dL — AB (ref 0–200)
LDL Cholesterol: UNDETERMINED mg/dL (ref 0–99)
TRIGLYCERIDES: 2231 mg/dL — AB (ref <150)
VLDL: UNDETERMINED mg/dL (ref 0–40)

## 2014-11-30 LAB — T4, FREE: FREE T4: 1.05 ng/dL (ref 0.80–1.80)

## 2014-11-30 LAB — HEPATITIS PANEL, ACUTE
HCV Ab: NEGATIVE
Hep A IgM: NONREACTIVE
Hep B C IgM: NONREACTIVE
Hepatitis B Surface Ag: NEGATIVE

## 2014-11-30 LAB — PROTIME-INR
INR: 1.26 (ref 0.00–1.49)
PROTHROMBIN TIME: 15.9 s — AB (ref 11.6–15.2)

## 2014-11-30 LAB — CK: Total CK: 554 U/L — ABNORMAL HIGH (ref 7–232)

## 2014-11-30 LAB — MAGNESIUM: Magnesium: 2.8 mg/dL — ABNORMAL HIGH (ref 1.5–2.5)

## 2014-11-30 MED ORDER — FOLIC ACID 1 MG PO TABS
1.0000 mg | ORAL_TABLET | Freq: Every day | ORAL | Status: DC
  Administered 2014-11-30 – 2014-12-01 (×2): 1 mg via ORAL
  Filled 2014-11-30 (×2): qty 1

## 2014-11-30 MED ORDER — ADULT MULTIVITAMIN W/MINERALS CH
1.0000 | ORAL_TABLET | Freq: Every day | ORAL | Status: DC
  Administered 2014-11-30 – 2014-12-01 (×2): 1 via ORAL
  Filled 2014-11-30 (×2): qty 1

## 2014-11-30 MED ORDER — LORAZEPAM 2 MG/ML IJ SOLN: 1.0000 mg | Freq: Four times a day (QID) | INTRAMUSCULAR | Status: DC | PRN

## 2014-11-30 MED ORDER — SODIUM CHLORIDE 0.9 % IV SOLN: INTRAVENOUS | Status: DC

## 2014-11-30 MED ORDER — THIAMINE HCL 100 MG/ML IJ SOLN
100.0000 mg | Freq: Every day | INTRAMUSCULAR | Status: DC
  Filled 2014-11-30: qty 1

## 2014-11-30 MED ORDER — LABETALOL HCL 5 MG/ML IV SOLN
10.0000 mg | INTRAVENOUS | Status: DC | PRN
  Filled 2014-11-30: qty 4

## 2014-11-30 MED ORDER — MORPHINE SULFATE 2 MG/ML IJ SOLN: 1.0000 mg | INTRAMUSCULAR | Status: DC | PRN

## 2014-11-30 MED ORDER — ONDANSETRON HCL 4 MG PO TABS: 4.0000 mg | ORAL_TABLET | Freq: Four times a day (QID) | ORAL | Status: DC | PRN

## 2014-11-30 MED ORDER — ALUM & MAG HYDROXIDE-SIMETH 200-200-20 MG/5ML PO SUSP: 30.0000 mL | Freq: Four times a day (QID) | ORAL | Status: DC | PRN

## 2014-11-30 MED ORDER — SODIUM CHLORIDE 0.9 % IV BOLUS (SEPSIS)
1000.0000 mL | Freq: Once | INTRAVENOUS | Status: AC
  Administered 2014-11-30: 1000 mL via INTRAVENOUS

## 2014-11-30 MED ORDER — METOPROLOL TARTRATE 50 MG PO TABS
50.0000 mg | ORAL_TABLET | Freq: Two times a day (BID) | ORAL | Status: DC
  Administered 2014-11-30 – 2014-12-01 (×3): 50 mg via ORAL
  Filled 2014-11-30: qty 1
  Filled 2014-11-30: qty 2
  Filled 2014-11-30 (×2): qty 1

## 2014-11-30 MED ORDER — LABETALOL HCL 5 MG/ML IV SOLN
10.0000 mg | Freq: Once | INTRAVENOUS | Status: AC
  Administered 2014-11-30: 10 mg via INTRAVENOUS
  Filled 2014-11-30: qty 4

## 2014-11-30 MED ORDER — OXYCODONE HCL 5 MG PO TABS: 5.0000 mg | ORAL_TABLET | ORAL | Status: DC | PRN

## 2014-11-30 MED ORDER — VITAMIN B-1 100 MG PO TABS
100.0000 mg | ORAL_TABLET | Freq: Every day | ORAL | Status: DC
  Administered 2014-11-30 – 2014-12-01 (×2): 100 mg via ORAL
  Filled 2014-11-30 (×2): qty 1

## 2014-11-30 MED ORDER — LORAZEPAM 1 MG PO TABS
1.0000 mg | ORAL_TABLET | Freq: Four times a day (QID) | ORAL | Status: DC | PRN
  Administered 2014-12-01: 1 mg via ORAL
  Filled 2014-11-30: qty 1

## 2014-11-30 MED ORDER — ONDANSETRON HCL 4 MG/2ML IJ SOLN: 4.0000 mg | Freq: Four times a day (QID) | INTRAMUSCULAR | Status: DC | PRN

## 2014-11-30 MED ORDER — SODIUM CHLORIDE 0.9 % IV SOLN
INTRAVENOUS | Status: DC
  Administered 2014-11-30 – 2014-12-01 (×2): via INTRAVENOUS

## 2014-11-30 MED ORDER — LORAZEPAM 2 MG/ML IJ SOLN
0.5000 mg | Freq: Once | INTRAMUSCULAR | Status: AC
  Administered 2014-11-30: 0.5 mg via INTRAVENOUS
  Filled 2014-11-30: qty 1

## 2014-11-30 NOTE — ED Notes (Signed)
Main lab called, light green and lavender tube samples hemolyzed, needs recollecting.

## 2014-11-30 NOTE — ED Notes (Signed)
Dr. Zamora at bedside.  

## 2014-11-30 NOTE — Progress Notes (Signed)
Patient walked unit-200 feet. No oxygen or SOB. Patient stated that he felt fine and would continue walking each day. Refuses scd- educated. Will continue to monitor

## 2014-11-30 NOTE — ED Notes (Addendum)
Spoke with [place], [place], in regards to delay in labs.

## 2014-11-30 NOTE — ED Notes (Signed)
Attempted report x1. 

## 2014-11-30 NOTE — ED Provider Notes (Signed)
CSN: [id number]     Arrival date & time 11/30/14  0348 History   First MD Initiated Contact with Patient 11/30/14 0602     Chief Complaint  Patient presents with  . Palpitations     (Consider location/radiation/quality/duration/timing/severity/associated sxs/prior Treatment) HPI   44 year old male with history of hypertension and alcohol abuse presenting complaining of intermittent palpitation. Patient reports he has had intermittent heart palpitation for the past 2-3 years. Symptoms usually resolve without any specific treatment. However last night his heart palpitation was lasting much longer than usual. Patient states he became diaphoretic with heart palpitation along with some lightheadedness and fatigue but no associated chest pain or shortness of breath. Patient was switched from benazepril to metoprolol several days ago when he f/u with his PCP for a regular check up.  Sts medication changes was to help with his BP and with his mentioning of heart palpitation.  However pt noticed that the palpitation has increased in frequency since on the medication. Sts palpitation is sporadic without obvious causative factors.  He also admits to drinking alcohol on a daily basis, half a pint a day but denies any street drug use or smoking.  Not on any diet pills, and denies hx of thyroid disease.  Did mention that his brother has hx of palpitations and syncope. Denies fever, headache, vision changes, back pain, abdominal pain, nausea vomiting diarrhea, dysuria, numbness or weakness.     Past Medical History  Diagnosis Date  . Hypertension    Past Surgical History  Procedure Laterality Date  . Spine surgery      44yo   Family History  Problem Relation Age of Onset  . Cancer Father     prostate  . Asthma Sister   . Diabetes Sister   . Diabetes Maternal Grandmother   . Heart disease Maternal Grandmother   . Diabetes Mother   . Hypertension Mother    History  Substance Use Topics  . Smoking  status: Former Smoker    Quit date: 04/20/2001  . Smokeless tobacco: Never Used  . Alcohol Use: Not on file    Review of Systems  All other systems reviewed and are negative.     Allergies  Review of patient's allergies indicates no known allergies.  Home Medications   Prior to Admission medications   Medication Sig Start Date End Date Taking? Authorizing Provider  benazepril (LOTENSIN) 40 MG tablet Take 1 tablet (40 mg total) by mouth daily. 11/27/14   Alycia Rossetti, MD  metoprolol succinate (TOPROL-XL) 25 MG 24 hr tablet Take 1 tablet (25 mg total) by mouth daily. 11/27/14   Alycia Rossetti, MD  Multiple Vitamin (MULTIVITAMIN) tablet Take 1 tablet by mouth daily.    Historical Provider, MD   BP 159/91 mmHg  Pulse 90  Temp(Src) 98.6 F (37 C) (Oral)  Resp 20  SpO2 99% Physical Exam  Constitutional: He is oriented to person, place, and time. He appears well-developed and well-nourished. No distress.  African-American male, laying in bed, in no acute distress however mildly diaphoretic.  HENT:  Head: Atraumatic.  Eyes: Conjunctivae are normal.  Neck: Normal range of motion. Neck supple.  Cardiovascular:  Tachycardia without murmurs rubs or gallops  Pulmonary/Chest: Effort normal and breath sounds normal.  Abdominal: Soft. There is no tenderness.  No abdominal bruit  Musculoskeletal: He exhibits no edema.  5 out 5 strength to all 4 extremities with intact distal pulses.  Neurological: He is alert and oriented to person, place,  and time.  Skin: No rash noted.  Psychiatric: He has a normal mood and affect.    ED Course  Procedures (including critical care time)  Patient presents for evaluation of heart palpitation. On exam, he is tachycardic with EKG shows heart rate of 116, no evidence of atrial fibrillation or atrial flutter or any other arrhythmia. Patient is an alcoholic drinker of moderate amount. He has history of hyperlipidemia and phlebotomy lab was unable to  process his blood as it hemolyzed.  Work up initiated.    Attempt to have pt perform vagal maneuver without relief.  IVF started.  Will monitor closely.  Care discussed with Dr. Tamera Punt.    8:19 AM Tachycardia improves with IV fluid. Patient felt better.  10:41 AM Pt has had 3 separate blood drawn that was unsuccessful due to hemolysis.  The most recent electrolytes shows K+ >7.5 (likely hemolysis as an istat chem 8 shows normal K)  EKG without peaked T-wave.  Hepatic function panel is elevated as well with AST 818, ALT 922, elevated alk phos and total bili.  He however does not have any abdominal discomfort and no appreciable hepatomegaly on exam.  Pt is a drinker therefore will check hepatitis panel, obtain abd [place], check tylenol level and lipase level. Pt however denies taking tylenol regularly.    11:43 AM 5 consult with [facility] hospitalist, Dr. Coralyn Pear who agrees to see and admit patient for further management of his transaminitis. Patient will be admitted to a telemetry bed. Patient is aware of plan. Suspect alcoholic hepatitis with possible liver cirrhosis.  Labs Review Labs Reviewed  CBC WITH DIFFERENTIAL/PLATELET - Abnormal; Notable for the following:    MCHC >37.0 (*)    Monocytes Relative 14 (*)    All other components within normal limits  COMPREHENSIVE METABOLIC PANEL - Abnormal; Notable for the following:    Sodium 131 (*)    Potassium >7.5 (*)    Glucose, Bld 111 (*)    Calcium 7.7 (*)    Albumin 3.2 (*)    AST 818 (*)    ALT 922 (*)    Total Bilirubin 8.9 (*)    All other components within normal limits  MAGNESIUM - Abnormal; Notable for the following:    Magnesium 2.8 (*)    All other components within normal limits  CBG MONITORING, ED - Abnormal; Notable for the following:    Glucose-Capillary 123 (*)    All other components within normal limits  I-STAT CHEM 8, ED - Abnormal; Notable for the following:    Sodium 131 (*)    Glucose, Bld 115 (*)    Calcium, Ion 0.93  (*)    Hemoglobin 17.7 (*)    All other components within normal limits  TSH  T4, FREE  CBC WITH DIFFERENTIAL/PLATELET  HEPATITIS PANEL, ACUTE  ACETAMINOPHEN LEVEL  PROTIME-INR  LIPASE, BLOOD    Imaging Review Dg Chest 2 View  11/30/2014   CLINICAL DATA:  Dyspnea, palpitations.  EXAM: CHEST  2 VIEW  COMPARISON:  None.  FINDINGS: The heart size and mediastinal contours are within normal limits. Both lungs are clear. The visualized skeletal structures are unremarkable.  IMPRESSION: No active cardiopulmonary disease.   Electronically Signed   By: Andreas Newport M.D.   On: 11/30/2014 06:01     EKG Interpretation   Date/Time:  Friday November 30 2014 03:58:09 EST Ventricular Rate:  91 PR Interval:  156 QRS Duration: 84 QT Interval:  382 QTC Calculation: 469 R Axis:  34 Text Interpretation:  Normal sinus rhythm Normal ECG Confirmed by Glynn Octave [phone]) on 11/30/2014 5:06:09 AM      MDM   Final diagnoses:  Transaminitis  Dehydration  Palpitation    BP 155/91 mmHg  Pulse 97  Temp(Src) 98.6 F (37 C) (Oral)  Resp 19  SpO2 97%  I have reviewed nursing notes and vital signs. I personally reviewed the imaging tests through PACS system  I reviewed available ER/hospitalization records thought the EMR     Domenic Moras, PA-C 11/30/14 [place], MD 11/30/14 2146

## 2014-11-30 NOTE — ED Notes (Signed)
Received call from main lab.

## 2014-11-30 NOTE — ED Notes (Signed)
Patient returned from xray.

## 2014-11-30 NOTE — ED Notes (Signed)
Patient transported to Ultrasound 

## 2014-11-30 NOTE — ED Notes (Signed)
Per Dr. Vanessa BarbaraZamora, pt may have heart healthy diet.

## 2014-11-30 NOTE — ED Notes (Signed)
PA back at the bedside.  

## 2014-11-30 NOTE — ED Notes (Signed)
Pt. reports intermittent palpitation onset  yesterday , denies chest pain / no SOB .

## 2014-11-30 NOTE — H&P (Signed)
[facility] History and Physical  Barry MartinezShaunte L Marrocco WGN:[id number]:[id number] DOB: 08/23/1971 DOA: 11/30/2014  Referring physician:  PCP: Milinda AntisURHAM, KAWANTA, MD   Chief Complaint: Palpitations  HPI: Barry Harris is a 44 y.o. male with a past medical history of hypertension and generalized anxiety disorder presenting to the emergency department with complaints of palpitations. History reporting having palpitations occurring intermittently over the past year, and had been prescribed beta blocker therapy by his primary care physician. Workup in the emergency department however revealed elevated transaminases having an AST of 818, ALT of 922, total bilirubin of 8.9 with an alkaline phosphatase of 115. He admits to drinking approximately half a pint of alcohol daily, and feels that he may be self-medicating with his generalized anxiety disorder. He denies recent travel, sick contacts, intravenous drug use, fevers, chills, abdominal pain, nausea, vomiting, hematemesis, bloody stools or melena. Patient had lab work drawn on 01/22/2014 at which time he had normal transaminase levels.                                      Review of Systems:  Constitutional:  No weight loss, night sweats, Fevers, chills, fatigue. Positive for anxiety. HEENT:  No headaches, Difficulty swallowing,Tooth/dental problems,Sore throat,  No sneezing, itching, ear ache, nasal congestion, post nasal drip,  Cardio-vascular:  No chest pain, Orthopnea, PND, swelling in lower extremities, anasarca, dizziness, positive for palpitations  GI:  No heartburn, indigestion, abdominal pain, nausea, vomiting, diarrhea, change in bowel habits, loss of appetite  Resp:  No shortness of breath with exertion or at rest. No excess mucus, no productive cough, No non-productive cough, No coughing up of blood.No change in color of mucus.No wheezing.No chest wall deformity  Skin:  no rash or lesions.  GU:  no dysuria, change in color of urine, no  urgency or frequency. No flank pain.  Musculoskeletal:  No joint pain or swelling. No decreased range of motion. No back pain.  Psych:  No change in mood or affect. No depression or anxiety. No memory loss.   Past Medical History  Diagnosis Date  . Hypertension    Past Surgical History  Procedure Laterality Date  . Spine surgery      44yo   Social History:  reports that he quit smoking about 13 years ago. He has never used smokeless tobacco. He reports that he does not use illicit drugs. His alcohol history is not on file.  No Known Allergies  Family History  Problem Relation Age of Onset  . Cancer Father     prostate  . Asthma Sister   . Diabetes Sister   . Diabetes Maternal Grandmother   . Heart disease Maternal Grandmother   . Diabetes Mother   . Hypertension Mother     Prior to Admission medications   Medication Sig Start Date End Date Taking? Authorizing Provider  benazepril (LOTENSIN) 40 MG tablet Take 1 tablet (40 mg total) by mouth daily. 11/27/14  Yes Salley ScarletKawanta F Russell, MD  Coenzyme Q10 (CO Q 10 PO) Take 1 tablet by mouth daily.   Yes Historical Provider, MD  metoprolol succinate (TOPROL-XL) 25 MG 24 hr tablet Take 1 tablet (25 mg total) by mouth daily. 11/27/14  Yes Salley ScarletKawanta F [place], MD  Multiple Vitamin (MULTIVITAMIN) tablet Take 1 tablet by mouth daily.   Yes Historical Provider, MD  Omega-3 Fatty Acids (OMEGA-3 FISH OIL PO) Take 1 tablet by mouth  2 (two) times daily.   Yes Historical Provider, MD   Physical Exam: Filed Vitals:   11/30/14 1100 11/30/14 1115 11/30/14 1135 11/30/14 1145  BP: 154/83 149/86 155/91 161/93  Pulse: 112 106 97 95  Temp:      TempSrc:      Resp: SpO2: 99% 98% 97% 99%    Wt Readings from Last 3 Encounters:  11/27/14 130.636 kg (288 lb)  06/12/14 128.368 kg (283 lb)  06/01/14 128.277 kg (282 lb 12.8 oz)    General:  Appears calm and comfortable, does not appear to be in acute distress. Eyes: PERRL, normal lids,  irises & conjunctiva ENT: grossly normal hearing, lips & tongue Neck: no LAD, masses or thyromegaly Cardiovascular: Tachycardic, RRR, no m/r/g. No LE edema. Telemetry: SR, no arrhythmias  Respiratory: CTA bilaterally, no w/r/r. Normal respiratory effort. Abdomen: soft, there is some pain with deep palpation to his right upper quadrant, possible positive Murphy's sign. Otherwise did not palpate hepatosplenomegaly, there is no evidence of peritonitis. Skin: no rash or induration seen on limited exam Musculoskeletal: grossly normal tone BUE/BLE Psychiatric: grossly normal mood and affect, speech fluent and appropriate Neurologic: grossly non-focal.          Labs on Admission:  Basic Metabolic Panel:  Recent Labs Lab 11/30/14 0840 11/30/14 0856  NA 131* 131*  K >7.5* 4.9  CL 100 105  CO2 23  --   GLUCOSE 111* 115*  BUN 10 14  CREATININE 0.92 0.80  CALCIUM 7.7*  --   MG 2.8*  --    Liver Function Tests:  Recent Labs Lab 11/30/14 0840  AST 818*  ALT 922*  ALKPHOS 115  BILITOT 8.9*  PROT UNABLE TO DETERMINE DUE TO GROSS HEMOLYSIS  ALBUMIN 3.2*    Recent Labs Lab 11/30/14 1107  LIPASE 44   No results for input(s): AMMONIA in the last 168 hours. CBC:  Recent Labs Lab 11/30/14 0840 11/30/14 0856  WBC 4.1  --   NEUTROABS 2.5  --   HGB 15.1 17.7*  HCT 40.4 52.0  MCV 79.4  --   PLT 268  --    Cardiac Enzymes: No results for input(s): CKTOTAL, CKMB, CKMBINDEX, TROPONINI in the last 168 hours.  BNP (last 3 results) No results for input(s): BNP in the last 8760 hours.  ProBNP (last 3 results) No results for input(s): PROBNP in the last 8760 hours.  CBG:  Recent Labs Lab 11/30/14 0524  GLUCAP 123*    Radiological Exams on Admission: Dg Chest 2 View  11/30/2014   CLINICAL DATA:  Dyspnea, palpitations.  EXAM: CHEST  2 VIEW  COMPARISON:  None.  FINDINGS: The heart size and mediastinal contours are within normal limits. Both lungs are clear. The visualized  skeletal structures are unremarkable.  IMPRESSION: No active cardiopulmonary disease.   Electronically Signed   By: Ellery Plunk M.D.   On: 11/30/2014 06:01    EKG: Independently reviewed.   Assessment/Plan Principal Problem:   Elevated transaminase level Active Problems:   Hyponatremia   Hypertension   GAD (generalized anxiety disorder)   Palpitations   Alcohol abuse   1. Elevated transaminases. Patient found to have an elevated AST of 818 with ALT of 922, total bilirubin of 8.9 in the emergency room. It appears he had normal LFTs on 01/22/2014. The differential remains broad however he did appear to have a positive Murphy sign on physical examination, raising concern for biliary disease. This also may  reflect acute alcoholic hepatitis having history of alcohol abuse (stating drinking about half a pint of liquor multiple days out of the week) although he did not have the 2-1 AST to ALT ratio. Abdominal ultrasound has been ordered, results pending at the time of this dictation. Will check for other possibilities, order hepatitis panel, HIV, LDH, transferrin level, ferritin, antinuclear antibodies, anti-smooth muscle antibodies, and lipase level. Plan to repeat a CMP in a.m. 2. Alcohol abuse. Patient does not appear to have clinical signs or symptoms for just an acute alcohol withdrawal. We'll place him however on the withdrawal protocol with IV Ativan. 3. Hypertension. Will discontinue ACE inhibitor therapy, increase his metoprolol to 50 mg by mouth twice a day. Patient hypertensive in the emergency department for which 10 mg of IV labetalol given. 4. Hyponatremia. Lab work showing sodium 131 which could be related to hypovolemia/dehydration. Will provide IV fluid resuscitation with normal saline. 5. Generalized anxiety disorder. Patient on when necessary Ativan 6. Palpitations. Suspect may be related to generalized anxiety disorder. EKG showed sinus tachycardia. 7. DVT prophylaxis. SCDs     Code Status: Full code Family Communication: Spoke to his mother and sister who were present at bedside Disposition Plan: Anticipate patient will require greater than 2 nights hospitalization will admit to the inpatient service  Time spent: 70 min  Jeralyn Bennett [facility] Pager [phone]

## 2014-11-30 NOTE — ED Notes (Signed)
PA student at bedside updating patient and family on plan of care

## 2014-11-30 NOTE — ED Notes (Signed)
Lab at the bedside 

## 2014-11-30 NOTE — ED Notes (Signed)
Family member with pt. nad noted, abc intact.

## 2014-11-30 NOTE — ED Notes (Signed)
Patient requested food to eat, [name], [place], stops by to discuss plan of care, and at this time, no food.

## 2014-12-01 LAB — COMPREHENSIVE METABOLIC PANEL
ALK PHOS: 123 U/L — AB (ref 39–117)
ALT: 644 U/L — ABNORMAL HIGH (ref 0–53)
AST: 391 U/L — AB (ref 0–37)
Albumin: 2.9 g/dL — ABNORMAL LOW (ref 3.5–5.2)
Anion gap: 4 — ABNORMAL LOW (ref 5–15)
BILIRUBIN TOTAL: 2.4 mg/dL — AB (ref 0.3–1.2)
CHLORIDE: 108 mmol/L (ref 96–112)
CO2: 23 mmol/L (ref 19–32)
Calcium: 8.1 mg/dL — ABNORMAL LOW (ref 8.4–10.5)
Creatinine, Ser: 0.84 mg/dL (ref 0.50–1.35)
GFR calc Af Amer: >90 mL/min (ref >90)
Glucose, Bld: 113 mg/dL — ABNORMAL HIGH (ref 70–99)
Potassium: 4.2 mmol/L (ref 3.5–5.1)
SODIUM: 135 mmol/L (ref 135–145)
TOTAL PROTEIN: 7.8 g/dL (ref 6.0–8.3)

## 2014-12-01 LAB — CBC
HCT: 40.9 % (ref 39.0–52.0)
Hemoglobin: 15.1 g/dL (ref 13.0–17.0)
MCH: 29.8 pg (ref 26.0–34.0)
MCHC: 36.9 g/dL — ABNORMAL HIGH (ref 30.0–36.0)
MCV: 80.8 fL (ref 78.0–100.0)
PLATELETS: 151 10*3/uL (ref 150–400)
RBC: 5.06 MIL/uL (ref 4.22–5.81)
RDW: 14.2 % (ref 11.5–15.5)
WBC: 4.3 10*3/uL (ref 4.0–10.5)

## 2014-12-01 LAB — FERRITIN: Ferritin: 2010 ng/mL — ABNORMAL HIGH (ref 22–322)

## 2014-12-01 MED ORDER — METOPROLOL TARTRATE 50 MG PO TABS: 50.0000 mg | ORAL_TABLET | Freq: Two times a day (BID) | ORAL | Status: DC

## 2014-12-01 NOTE — Progress Notes (Signed)
Nsg Discharge Note  Admit Date:  11/30/2014 Discharge date: 12/01/2014   Alfredo MartinezShaunte L Pullara to be D/C'd Home per MD order.  AVS completed.  Copy for chart, and copy for patient signed, and dated. Patient/caregiver able to verbalize understanding.  Discharge Medication:   Medication List    STOP taking these medications        benazepril 40 MG tablet  Commonly known as:  LOTENSIN     CO Q 10 PO     metoprolol succinate 25 MG 24 hr tablet  Commonly known as:  TOPROL-XL     OMEGA-3 FISH OIL PO      TAKE these medications        metoprolol 50 MG tablet  Commonly known as:  LOPRESSOR  Take 1 tablet (50 mg total) by mouth 2 (two) times daily.     multivitamin tablet  Take 1 tablet by mouth daily.        Discharge Assessment: Filed Vitals:   12/01/14 1049  BP: 161/91  Pulse: 106  Temp:   Resp:    Skin clean, dry and intact without evidence of skin break down, no evidence of skin tears noted. IV catheter discontinued intact. Site without signs and symptoms of complications - no redness or edema noted at insertion site, patient denies c/o pain - only slight tenderness at site.  Dressing with slight pressure applied.  D/c Instructions-Education: Discharge instructions given to patient/family with verbalized understanding. D/c education completed with patient/family including follow up instructions, medication list, d/c activities limitations if indicated, with other d/c instructions as indicated by MD - patient able to verbalize understanding, all questions fully answered. Patient instructed to return to ED, call 911, or call MD for any changes in condition.  Patient escorted via WC, and D/C home via private auto.  Jeramine Delis Consuella Loselaine, RN 12/01/2014 11:27 AM

## 2014-12-01 NOTE — Discharge Summary (Signed)
PATIENT DETAILS Name: Barry Harris Age: 44 y.o. Sex: male Date of Birth: 1971-06-17 MRN: [id number]. Admitting Physician: Jeralyn Bennett, MD WUJ:[id number], KAWANTA, MD  Admit Date: 11/30/2014 Discharge date: 12/01/2014  Recommendations for Outpatient Follow-up:  1. HIV, A1c, ANA, anti-smooth muscle antibody, transferrin-pending at the time of discharge. Please follow 2. Please check LFTs and lipid panel at next visit. 3. Patient needs ongoing counseling regarding complete cessation of alcohol use. 4. Note: Ferritin significantly elevated at 2010-Will need GI further work up if LFTs do not come down to rule out hemochromatosis. 5. Note: Triglycerides significantly elevated at 2231-unable to start antilipid medication given deranged LFTs. Patient completely asymptomatic-without any abdominal pain. Please start antilipid medications once LFTs have come down further.  PRIMARY DISCHARGE DIAGNOSIS:  Principal Problem:   Elevated transaminase level Active Problems:   Hypertension   GAD (generalized anxiety disorder)   Palpitations   Alcohol abuse   Hyponatremia      PAST MEDICAL HISTORY: Past Medical History  Diagnosis Date  . Hypertension     DISCHARGE MEDICATIONS: Current Discharge Medication List    START taking these medications   Details  metoprolol (LOPRESSOR) 50 MG tablet Take 1 tablet (50 mg total) by mouth 2 (two) times daily. Qty: 60 tablet, Refills: 0      CONTINUE these medications which have NOT CHANGED   Details  Multiple Vitamin (MULTIVITAMIN) tablet Take 1 tablet by mouth daily.      STOP taking these medications     benazepril (LOTENSIN) 40 MG tablet      Coenzyme Q10 (CO Q 10 PO)      metoprolol succinate (TOPROL-XL) 25 MG 24 hr tablet      Omega-3 Fatty Acids (OMEGA-3 FISH OIL PO)         ALLERGIES:  No Known Allergies  BRIEF HPI:  See H&P, Labs, Consult and Test reports for all details in brief, patient was admitted for evaluation  of significant elevation in his liver enzymes. Patient has a history of hypertension, and drinks about half a pint of Vodka every other day or so.  CONSULTATIONS:   None  PERTINENT RADIOLOGIC STUDIES: Dg Chest 2 View  11/30/2014   CLINICAL DATA:  Dyspnea, palpitations.  EXAM: CHEST  2 VIEW  COMPARISON:  None.  FINDINGS: The heart size and mediastinal contours are within normal limits. Both lungs are clear. The visualized skeletal structures are unremarkable.  IMPRESSION: No active cardiopulmonary disease.   Electronically Signed   By: Ellery Plunk M.D.   On: 11/30/2014 06:01   US Abdomen Complete  11/30/2014   CLINICAL DATA:  Transaminitis, history smoking and hypertension  EXAM: ULTRASOUND ABDOMEN COMPLETE  COMPARISON:  None  FINDINGS: Gallbladder: Normally distended without stones or wall thickening.  No pericholecystic fluid or sonographic Murphy sign.  Common bile duct: Diameter: Normal caliber 5 mm diameter  Liver: Echogenic, likely fatty infiltration, though this can be seen with cirrhosis and certain infiltrative disorders. Question mild focal sparing at the caudate lobe. No focal mass or nodularity. Hepatopetal portal venous flow.  IVC: Normal appearance  Pancreas: Tail incompletely visualized due to bowel gas. Visualized portions normal appearance.  Spleen: Normal appearance, 7.0 cm length  Right Kidney: Length: 13.8 cm. Normal morphology without mass or hydronephrosis.  Left Kidney: Length: 12.8 cm. Normal morphology without mass or hydronephrosis.  Abdominal aorta: Distally obscured by bowel gas. Visualized portion normal caliber.  Other findings: No free-fluid  IMPRESSION: Probable fatty infiltration of liver as above.  Incomplete visualization of pancreatic tail and distal aorta.  Otherwise negative exam.   Electronically Signed   By: Ulyses Southward M.D.   On: 11/30/2014 14:35     PERTINENT LAB RESULTS: CBC:  Recent Labs  11/30/14 0840 11/30/14 0856 12/01/14 0602  WBC 4.1  --   4.3  HGB 15.1 17.7* 15.1  HCT 40.4 52.0 40.9  PLT 268  --  151   CMET CMP     Component Value Date/Time   NA 135 12/01/2014 0602   K 4.2 12/01/2014 0602   CL 108 12/01/2014 0602   CO2 23 12/01/2014 0602   GLUCOSE 113* 12/01/2014 0602   BUN <5* 12/01/2014 0602   CREATININE 0.84 12/01/2014 0602   CREATININE 0.82 01/22/2014 0823   CALCIUM 8.1* 12/01/2014 0602   PROT 7.8 12/01/2014 0602   ALBUMIN 2.9* 12/01/2014 0602   AST 391* 12/01/2014 0602   ALT 644* 12/01/2014 0602   ALKPHOS 123* 12/01/2014 0602   BILITOT 2.4* 12/01/2014 0602   GFRNONAA >90 12/01/2014 0602   GFRNONAA >89 01/22/2014 0823   GFRAA >90 12/01/2014 0602   GFRAA >89 01/22/2014 0823    GFR Estimated Creatinine Clearance: 155.9 mL/min (by C-G formula based on Cr of 0.84).  Recent Labs  11/30/14 1107 11/30/14 1408  LIPASE 44 74*    Recent Labs  11/30/14 1408  CKTOTAL 554*   Invalid input(s): POCBNP No results for input(s): DDIMER in the last 72 hours. No results for input(s): HGBA1C in the last 72 hours.  Recent Labs  11/30/14 1417  CHOL 219*  HDL NOT REPORTED DUE TO HIGH TRIGLYCERIDES  LDLCALC UNABLE TO CALCULATE IF TRIGLYCERIDE OVER 400 mg/dL  TRIG 1610*  CHOLHDL NOT REPORTED DUE TO HIGH TRIGLYCERIDES    Recent Labs  11/30/14 0512  TSH 2.269    Recent Labs  11/30/14 1408  FERRITIN 2010*   Coags:  Recent Labs  11/30/14 1107  INR 1.26   Microbiology: No results found for this or any previous visit (from the past 240 hour(s)).   BRIEF HOSPITAL COURSE:   Principal Problem: Acute hepatitis: Although has significant history of alcohol use (half a pint of vodka every other day)-pattern of transaminitis not consistent with alcohol use. Suspect this is multifactorial with alcohol, over-the-counter herbal supplements (patient on numerous over-the-counter supplements including cumin, milk thistle and other supplements) as the potential culprits. Abdominal ultrasound negative, acute  hepatitis panel negative. Significant workup still pending-please see above. These will need to be followed.However, has significantly elevated ferritin levels-with deranged LFTs-may need workup for hemachromatosis at some point. I would defer this to patient's primary care practitioner as well.   Please note, patient and family requesting discharge TODAY, they claim they will go see patient's primary care practitioner this coming Monday and get repeat labs there. Patient is completely asymptomatic with his lab abnormalities, belly is soft ad he tolerating diet. He is walking in the hallway without any major issues. He has no vomiting or abdominal pain at all. Since family and patient assure me that they will follow-up with primary care practitioner on 2/22, and that family will ensure that patient does not use any further alcohol or herbal supplements till they see patient's primary care practitioner, suspect he could be discharged at his own request with very close outpatient follow-up.  Active Problems:   Hypertriglyceridemia: Completely asymptomatic even with triglycerides more than 2000. No abdominal pain or vomiting at all to indicate pancreatitis. Unable to start antilipid medications given significantly elevated  LFTs still. Family and patient assure me that they will go to PCPs office on 2/22 and recheck LFTs and do the needful. Defer further workup/treatment to patient's primary care practitioner at this time.    Hypertension: Continue metoprolol.    Alcohol use: Counseled extensively and presence of family.    Hyponatremia: Resolved  TODAY-DAY OF DISCHARGE:  Subjective:   Thedore MinsShaunte Fritze today has no headache,no chest abdominal pain,no new weakness tingling or numbness, feels much better wants to go home today.  Objective:   Blood pressure 142/84, pulse 71, temperature 97.7 F (36.5 C), temperature source Oral, resp. rate 15, height 5\' 11"  (1.803 m), weight 127.5 kg (281 lb 1.4 oz),  SpO2 100 %.  Intake/Output Summary (Last 24 hours) at 12/01/14 1033 Last data filed at 12/01/14 0600  Gross per 24 hour  Intake 1141.67 ml  Output    600 ml  Net 541.67 ml   Filed Weights   11/30/14 1830  Weight: 127.5 kg (281 lb 1.4 oz)    Exam Awake Alert, Oriented *3, No new F.N deficits, Normal affect [address].AT,PERRAL Supple Neck,No JVD, No cervical lymphadenopathy appriciated.  Symmetrical Chest wall movement, Good air movement bilaterally, CTAB RRR,No Gallops,Rubs or new Murmurs, No Parasternal Heave +ve B.Sounds, Abd Soft, Non tender, No organomegaly appriciated, No rebound -guarding or rigidity. No Cyanosis, Clubbing or edema, No new Rash or bruise  DISCHARGE CONDITION: Stable  DISPOSITION: Home  DISCHARGE INSTRUCTIONS:    Activity:  As tolerated   Diet recommendation: Heart Healthy diet  Discharge Instructions    Call MD for:  persistant nausea and vomiting    Complete by:  As directed      Call MD for:  severe uncontrolled pain    Complete by:  As directed      Diet - low sodium heart healthy    Complete by:  As directed      Discharge instructions    Complete by:  As directed   Please ask your primary care practitioner to follow-up labs that are pending at the time of discharge  Avoid Tylenol, alcohol use. Avoid herbal supplements. Please consult your primary care practitioner before starting any medications including herbal supplements or over-the-counter medications.  No further alcohol use     Increase activity slowly    Complete by:  As directed            Follow-up Information    Follow up with Milinda AntisURHAM, KAWANTA, MD On 12/03/2014.   Specialty:  Family Medicine   Why:  please ask your PCP to check your liver enzymes and lipid panel at next visit.   Contact information:   [address] [place] [id number] [phone]      Total Time spent on discharge equals 45 minutes.  SignedJeoffrey Massed: GHIMIRE,SHANKER 12/01/2014 10:33 AM

## 2014-12-01 NOTE — Discharge Instructions (Signed)
Please ask your primary care practitioner to follow-up labs that are pending at the time of discharge  Avoid Tylenol, alcohol use. Avoid herbal supplements. Please consult your primary care practitioner before starting any medications including herbal supplements or over-the-counter medications.  No further alcohol use

## 2014-12-03 LAB — ANTI-SMOOTH MUSCLE ANTIBODY, IGG

## 2014-12-03 LAB — TRANSFERRIN

## 2014-12-03 LAB — HIV ANTIBODY (ROUTINE TESTING W REFLEX): HIV Screen 4th Generation wRfx: NONREACTIVE

## 2014-12-03 LAB — HEMOGLOBIN A1C
Hgb A1c MFr Bld: 5.9 % — ABNORMAL HIGH (ref 4.8–5.6)
Mean Plasma Glucose: 123 mg/dL

## 2014-12-03 LAB — ANTINUCLEAR ANTIBODIES, IFA

## 2014-12-04 ENCOUNTER — Encounter: Payer: Self-pay | Admitting: Family Medicine

## 2014-12-04 ENCOUNTER — Ambulatory Visit (INDEPENDENT_AMBULATORY_CARE_PROVIDER_SITE_OTHER): Payer: BLUE CROSS/BLUE SHIELD | Admitting: Family Medicine

## 2014-12-04 VITALS — BP 158/102 | HR 84 | Temp 98.8°F | Resp 16 | Ht 71.0 in | Wt 280.0 lb

## 2014-12-04 DIAGNOSIS — E781 Pure hyperglyceridemia: Secondary | ICD-10-CM

## 2014-12-04 DIAGNOSIS — F411 Generalized anxiety disorder: Secondary | ICD-10-CM

## 2014-12-04 DIAGNOSIS — I10 Essential (primary) hypertension: Secondary | ICD-10-CM

## 2014-12-04 DIAGNOSIS — F1010 Alcohol abuse, uncomplicated: Secondary | ICD-10-CM

## 2014-12-04 DIAGNOSIS — R740 Nonspecific elevation of levels of transaminase and lactic acid dehydrogenase [LDH]: Secondary | ICD-10-CM

## 2014-12-04 DIAGNOSIS — R7401 Elevation of levels of liver transaminase levels: Secondary | ICD-10-CM

## 2014-12-04 LAB — CBC WITH DIFFERENTIAL/PLATELET
BASOS ABS: 0 10*3/uL (ref 0.0–0.1)
Eosinophils Absolute: 0 10*3/uL (ref 0.0–0.7)
HEMATOCRIT: 43.7 % (ref 39.0–52.0)
Hemoglobin: 15 g/dL (ref 13.0–17.0)
LYMPHS ABS: 1.5 10*3/uL (ref 0.7–4.0)
Lymphocytes Relative: 12 % (ref 12–46)
MCH: 29.9 pg (ref 26.0–34.0)
MCHC: 34.4 g/dL (ref 30.0–36.0)
MCV: 87.1 fL (ref 78.0–100.0)
MONOS PCT: 7 % (ref 3–12)
Monocytes Absolute: 0.9 10*3/uL (ref 0.1–1.0)
NEUTROS ABS: 10.4 10*3/uL — AB (ref 1.7–7.7)
Neutrophils Relative %: 81 % — ABNORMAL HIGH (ref 43–77)
Platelets: 117 10*3/uL — ABNORMAL LOW (ref 150–400)
RBC: 5.02 MIL/uL (ref 4.22–5.81)
RDW: 13.6 % (ref 11.5–15.5)
WBC: 12.8 10*3/uL — ABNORMAL HIGH (ref 4.0–10.5)

## 2014-12-04 LAB — COMPREHENSIVE METABOLIC PANEL
ALT: 300 U/L — ABNORMAL HIGH (ref 0–53)
AST: 96 U/L — ABNORMAL HIGH (ref 0–37)
Albumin: 3.3 g/dL — ABNORMAL LOW (ref 3.5–5.2)
Alkaline Phosphatase: 85 U/L (ref 39–117)
BUN: 12 mg/dL (ref 6–23)
CALCIUM: 9.5 mg/dL (ref 8.4–10.5)
CHLORIDE: 106 meq/L (ref 96–112)
CO2: 25 meq/L (ref 19–32)
Creat: 1 mg/dL (ref 0.50–1.35)
Glucose, Bld: 102 mg/dL — ABNORMAL HIGH (ref 70–99)
POTASSIUM: 4 meq/L (ref 3.5–5.3)
Sodium: 141 mEq/L (ref 135–145)
TOTAL PROTEIN: 6.8 g/dL (ref 6.0–8.3)
Total Bilirubin: 2 mg/dL — ABNORMAL HIGH (ref 0.2–1.2)

## 2014-12-04 NOTE — Progress Notes (Signed)
Patient ID: Barry MartinezShaunte L Valentine, male   DOB: 02/03/1971, 44 y.o.   MRN: [id number]   Subjective:    Patient ID: Barry MartinezShaunte L Andaya, male    DOB: 06/04/1971, 44 y.o.   MRN: [id number]  Patient presents for ER F/U  patient here to follow-up hospitalization. He was admitted after severe dehydration and severely elevated transaminases his initial set of labs showed an AST of 818 and an ALT of 922 his potassium was also 7.5 and he was hyponatremic. His lipid panel cannot be calculated because his triglycerides were 2231. Admit note he had normal labs about 10 months ago. He was checked for diabetes he is not diabetic. They did try date do some smooth muscle antibodies however because of the lipidemia these tests could not be run. History of panel was negative, his hepatitis screen was negative. He states that he did not realize his alcohol was a problem he typically drinks 1-1.5 pints of hard liquor almost daily on the weekends he would also use alcohol. He started drinking heavy about 2 years ago when he moved in to help take care of his father who had cancer. He is now still living with his mother and helping to care for her.  He denies any chest pain shortness of breath abdominal pain nausea vomiting he did not have any of the symptoms he just felt very weak when he went to the emergency room and he was having some palpitations that started that morning  Note he had over 50 different bottles of different supplements that he had been trying to help with weight loss and also decrease his blood pressure  Review Of Systems:  GEN- denies fatigue, fever, weight loss,weakness, recent illness HEENT- denies eye drainage, change in vision, nasal discharge, CVS- denies chest pain, palpitations RESP- denies SOB, cough, wheeze ABD- denies N/V, change in stools, abd pain GU- denies dysuria, hematuria, dribbling, incontinence MSK- denies joint pain, muscle aches, injury Neuro- denies headache, dizziness,  syncope, seizure activity       Objective:    BP 158/102 mmHg  Pulse 84  Temp(Src) 98.8 F (37.1 C) (Oral)  Resp 16  Ht 5\' 11"  (1.803 m)  Wt 280 lb (127.007 kg)  BMI 39.07 kg/m2 GEN- NAD, alert and oriented x3 HEENT- PERRL, EOMI, non injected sclera, pink conjunctiva, MMM, oropharynx clear CVS- RRR, no murmur RESP-CTAB ABD-NABS,soft,NT,ND Psych- anxious appearing not overly depressed. Well-groomed good eye contact EXT- No edema Pulses- Radial, DP- 2+        Assessment & Plan:      Problem List Items Addressed This Visit      Unprioritized   Hypertension    Increase metoprolol 200 mg twice a day we are holding the ACE inhibitor because of recent dehydration as well as to severe transaminitis I'll plan to restart this once everything else has returned to baseline.      Relevant Medications   metoprolol (LOPRESSOR) 50 MG tablet   GAD (generalized anxiety disorder)    We will start with BuSpar twice a day he agrees to take this medication. Fortunately he did not have any withdrawal symptoms from his alcohol therefore I will hold off on any benzodiazepines      Elevated transaminase level - Primary    He has had extremely high liver enzymes which I think is a result of his alcohol abuse as well as some other herbals that he has been taking over-the-counter. He has actually stopped alcohol completely in the past 5  days he did not have any withdrawal symptoms. He is also stopped taking his herbals or supplements. I am rechecking his labs today. His ultrasound showed fatty liver infiltration. As I do not continue to see improvement in his LFTs and I will refer him to gastroenterology      Relevant Orders   CBC with Differential/Platelet (Completed)   Comprehensive metabolic panel (Completed)   Alcohol abuse    He prefers to go into the substance abuse program associated with his work as it is free of charge. He will go ahead and call and set this up. He understands that  his alcohol abuse started after his father was sick with cancer he moved in to help take care of his parents. He also tries to self medicate his anxiety         Note: This dictation was prepared with Dragon dictation along with smaller phrase technology. Any transcriptional errors that result from this process are unintentional.

## 2014-12-04 NOTE — Assessment & Plan Note (Signed)
I would not treat this until his liver functions come down to normal history glycerides may actually resolve on its own as well

## 2014-12-04 NOTE — Patient Instructions (Signed)
Blood pressure medication increased to 100mg  twice a day Start the buspar as prescribed We will call with lab results Keep previous appt

## 2014-12-04 NOTE — Assessment & Plan Note (Addendum)
He prefers to go into the substance abuse program associated with his work as it is free of charge. He will go ahead and call and set this up. He understands that his alcohol abuse started after his father was sick with cancer he moved in to help take care of his parents. He also tries to self medicate his anxiety  We did discuss AA as well

## 2014-12-04 NOTE — Assessment & Plan Note (Signed)
We will start with BuSpar twice a day he agrees to take this medication. Fortunately he did not have any withdrawal symptoms from his alcohol therefore I will hold off on any benzodiazepines

## 2014-12-04 NOTE — Assessment & Plan Note (Signed)
Increase metoprolol 200 mg twice a day we are holding the ACE inhibitor because of recent dehydration as well as to severe transaminitis I'll plan to restart this once everything else has returned to baseline.

## 2014-12-04 NOTE — Assessment & Plan Note (Signed)
He has had extremely high liver enzymes which I think is a result of his alcohol abuse as well as some other herbals that he has been taking over-the-counter. He has actually stopped alcohol completely in the past 5 days he did not have any withdrawal symptoms. He is also stopped taking his herbals or supplements. I am rechecking his labs today. His ultrasound showed fatty liver infiltration. As I do not continue to see improvement in his LFTs and I will refer him to gastroenterology

## 2014-12-05 ENCOUNTER — Other Ambulatory Visit: Payer: Self-pay | Admitting: *Deleted

## 2014-12-05 DIAGNOSIS — R7989 Other specified abnormal findings of blood chemistry: Secondary | ICD-10-CM

## 2014-12-05 DIAGNOSIS — R945 Abnormal results of liver function studies: Principal | ICD-10-CM

## 2014-12-06 ENCOUNTER — Encounter: Payer: Self-pay | Admitting: Family Medicine

## 2014-12-10 ENCOUNTER — Other Ambulatory Visit: Payer: BLUE CROSS/BLUE SHIELD

## 2014-12-10 DIAGNOSIS — R7989 Other specified abnormal findings of blood chemistry: Secondary | ICD-10-CM

## 2014-12-10 DIAGNOSIS — R945 Abnormal results of liver function studies: Principal | ICD-10-CM

## 2014-12-10 LAB — COMPLETE METABOLIC PANEL WITH GFR
ALBUMIN: 3.7 g/dL (ref 3.5–5.2)
ALK PHOS: 77 U/L (ref 39–117)
ALT: 79 U/L — AB (ref 0–53)
AST: 43 U/L — AB (ref 0–37)
BUN: 9 mg/dL (ref 6–23)
CO2: 24 mEq/L (ref 19–32)
Calcium: 9.7 mg/dL (ref 8.4–10.5)
Chloride: 102 mEq/L (ref 96–112)
Creat: 0.84 mg/dL (ref 0.50–1.35)
GFR, Est African American: >89 mL/min
GFR, Est Non African American: >89 mL/min
Glucose, Bld: 99 mg/dL (ref 70–99)
POTASSIUM: 4.6 meq/L (ref 3.5–5.3)
Sodium: 137 mEq/L (ref 135–145)
TOTAL PROTEIN: 6.8 g/dL (ref 6.0–8.3)
Total Bilirubin: 1 mg/dL (ref 0.2–1.2)

## 2014-12-12 ENCOUNTER — Other Ambulatory Visit: Payer: Self-pay | Admitting: Family Medicine

## 2014-12-12 NOTE — Telephone Encounter (Signed)
Refill appropriate and filled per protocol. 

## 2014-12-21 ENCOUNTER — Telehealth: Payer: Self-pay | Admitting: Family Medicine

## 2014-12-21 NOTE — Telephone Encounter (Signed)
Pt brought in FMLA ppw and [place] [facility] [phone]

## 2014-12-25 ENCOUNTER — Ambulatory Visit: Payer: BLUE CROSS/BLUE SHIELD | Admitting: Family Medicine

## 2014-12-25 NOTE — Telephone Encounter (Signed)
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

## 2014-12-28 ENCOUNTER — Encounter: Payer: Self-pay | Admitting: Family Medicine

## 2014-12-28 ENCOUNTER — Ambulatory Visit: Payer: BLUE CROSS/BLUE SHIELD | Admitting: Family Medicine

## 2014-12-28 NOTE — Telephone Encounter (Signed)
Tried calling pt again at both numbers left message with pt to return my call

## 2015-01-01 NOTE — Telephone Encounter (Signed)
Patient is returning your call.  

## 2015-01-02 NOTE — Telephone Encounter (Signed)
Please make sure I have his job description

## 2015-01-02 NOTE — Telephone Encounter (Signed)
It's on his FMLA paper work top page

## 2015-01-02 NOTE — Telephone Encounter (Signed)
Called pt back and he states that he is needing the FMLA for when he was out of work and he had an option to either us PAL or FMLA and he chose FMLA, he states he was out of work on Feb 15-17 and Feb 19-24th returning on 24th for sickness.  FMLA ppw has been routed to Dr. Jeanice Limurham

## 2015-01-04 ENCOUNTER — Encounter: Payer: Self-pay | Admitting: Family Medicine

## 2015-01-07 ENCOUNTER — Encounter: Payer: Self-pay | Admitting: *Deleted

## 2015-01-07 NOTE — Telephone Encounter (Signed)
This encounter was created in error - please disregard.

## 2015-01-07 NOTE — Telephone Encounter (Signed)
Called pt on both cell and home number to return my call, FMLA ppw has been filled out and ready for pt to pick up, lmtrc

## 2015-01-08 NOTE — Telephone Encounter (Signed)
Called pt and he is aware ppw is ready for pick up and there is a $20 charge for form filled out, pt is coming into office to pay the fee and then fax the form

## 2015-02-04 ENCOUNTER — Encounter: Payer: Self-pay | Admitting: Family Medicine

## 2015-03-18 ENCOUNTER — Encounter: Payer: Self-pay | Admitting: Family Medicine

## 2016-10-16 ENCOUNTER — Emergency Department (HOSPITAL_COMMUNITY): Payer: BLUE CROSS/BLUE SHIELD

## 2016-10-16 ENCOUNTER — Emergency Department (HOSPITAL_COMMUNITY)
Admission: EM | Admit: 2016-10-16 | Discharge: 2016-10-16 | Disposition: A | Discharge status: Home / Self Care | Payer: BLUE CROSS/BLUE SHIELD | Attending: Emergency Medicine | Admitting: Emergency Medicine

## 2016-10-16 ENCOUNTER — Encounter (HOSPITAL_COMMUNITY): Payer: Self-pay | Admitting: Emergency Medicine

## 2016-10-16 DIAGNOSIS — Z79899 Other long term (current) drug therapy: Secondary | ICD-10-CM | POA: Insufficient documentation

## 2016-10-16 DIAGNOSIS — I10 Essential (primary) hypertension: Secondary | ICD-10-CM | POA: Diagnosis not present

## 2016-10-16 DIAGNOSIS — R009 Unspecified abnormalities of heart beat: Secondary | ICD-10-CM | POA: Diagnosis present

## 2016-10-16 DIAGNOSIS — Z87891 Personal history of nicotine dependence: Secondary | ICD-10-CM | POA: Insufficient documentation

## 2016-10-16 DIAGNOSIS — R002 Palpitations: Secondary | ICD-10-CM | POA: Diagnosis not present

## 2016-10-16 DIAGNOSIS — R748 Abnormal levels of other serum enzymes: Secondary | ICD-10-CM | POA: Diagnosis not present

## 2016-10-16 LAB — I-STAT CHEM 8, ED
BUN: 7 mg/dL (ref 6–20)
CREATININE: 0.8 mg/dL (ref 0.61–1.24)
Calcium, Ion: 1.05 mmol/L — ABNORMAL LOW (ref 1.15–1.40)
Chloride: 104 mmol/L (ref 101–111)
Glucose, Bld: 133 mg/dL — ABNORMAL HIGH (ref 65–99)
HEMATOCRIT: 41 % (ref 39.0–52.0)
HEMOGLOBIN: 13.9 g/dL (ref 13.0–17.0)
POTASSIUM: 4.2 mmol/L (ref 3.5–5.1)
SODIUM: 135 mmol/L (ref 135–145)
TCO2: 26 mmol/L (ref 0–100)

## 2016-10-16 LAB — CBC WITH DIFFERENTIAL/PLATELET
BASOS ABS: 0.1 10*3/uL (ref 0.0–0.1)
Basophils Relative: 1 %
EOS ABS: 0 10*3/uL (ref 0.0–0.7)
EOS PCT: 1 %
HCT: 37.7 % — ABNORMAL LOW (ref 39.0–52.0)
Hemoglobin: 13.7 g/dL (ref 13.0–17.0)
LYMPHS ABS: 1.1 10*3/uL (ref 0.7–4.0)
Lymphocytes Relative: 22 %
MCH: 31.6 pg (ref 26.0–34.0)
MCHC: 36.3 g/dL — ABNORMAL HIGH (ref 30.0–36.0)
MCV: 87.1 fL (ref 78.0–100.0)
MONO ABS: 0.4 10*3/uL (ref 0.1–1.0)
Monocytes Relative: 8 %
Neutro Abs: 3.4 10*3/uL (ref 1.7–7.7)
Neutrophils Relative %: 68 %
PLATELETS: 126 10*3/uL — AB (ref 150–400)
RBC: 4.33 MIL/uL (ref 4.22–5.81)
RDW: 13.5 % (ref 11.5–15.5)
WBC: 4.9 10*3/uL (ref 4.0–10.5)

## 2016-10-16 LAB — COMPREHENSIVE METABOLIC PANEL
ALT: 308 U/L — AB (ref 17–63)
ALT: 284 U/L — AB (ref 17–63)
AST: 550 U/L — ABNORMAL HIGH (ref 15–41)
AST: 493 U/L — AB (ref 15–41)
Albumin: 3.4 g/dL — ABNORMAL LOW (ref 3.5–5.0)
Albumin: 3.1 g/dL — ABNORMAL LOW (ref 3.5–5.0)
Alkaline Phosphatase: 126 U/L (ref 38–126)
Alkaline Phosphatase: 118 U/L (ref 38–126)
Anion gap: 12 (ref 5–15)
Anion gap: 14 (ref 5–15)
BUN: 5 mg/dL — ABNORMAL LOW (ref 6–20)
BUN: 6 mg/dL (ref 6–20)
CHLORIDE: 102 mmol/L (ref 101–111)
CHLORIDE: 101 mmol/L (ref 101–111)
CO2: 22 mmol/L (ref 22–32)
CO2: 18 mmol/L — AB (ref 22–32)
CREATININE: 0.89 mg/dL (ref 0.61–1.24)
CREATININE: 0.68 mg/dL (ref 0.61–1.24)
Calcium: 9.1 mg/dL (ref 8.9–10.3)
Calcium: 8.6 mg/dL — ABNORMAL LOW (ref 8.9–10.3)
GFR calc Af Amer: >60 mL/min (ref >60)
Glucose, Bld: 130 mg/dL — ABNORMAL HIGH (ref 65–99)
Glucose, Bld: 128 mg/dL — ABNORMAL HIGH (ref 65–99)
POTASSIUM: 4.2 mmol/L (ref 3.5–5.1)
Potassium: 4 mmol/L (ref 3.5–5.1)
SODIUM: 136 mmol/L (ref 135–145)
SODIUM: 133 mmol/L — AB (ref 135–145)
Total Bilirubin: 2.4 mg/dL — ABNORMAL HIGH (ref 0.3–1.2)
Total Bilirubin: 1.8 mg/dL — ABNORMAL HIGH (ref 0.3–1.2)
Total Protein: 7.4 g/dL (ref 6.5–8.1)
Total Protein: 6.8 g/dL (ref 6.5–8.1)

## 2016-10-16 LAB — URINALYSIS, ROUTINE W REFLEX MICROSCOPIC
Bilirubin Urine: NEGATIVE
Glucose, UA: NEGATIVE mg/dL
Hgb urine dipstick: NEGATIVE
Ketones, ur: NEGATIVE mg/dL
LEUKOCYTES UA: NEGATIVE
NITRITE: NEGATIVE
PH: 6 (ref 5.0–8.0)
Protein, ur: NEGATIVE mg/dL
SPECIFIC GRAVITY, URINE: 1.006 (ref 1.005–1.030)

## 2016-10-16 LAB — I-STAT TROPONIN, ED: TROPONIN I, POC: 0 ng/mL (ref 0.00–0.08)

## 2016-10-16 LAB — TSH: TSH: 2.592 u[IU]/mL (ref 0.350–4.500)

## 2016-10-16 MED ORDER — CHLORDIAZEPOXIDE HCL 25 MG PO CAPS: ORAL_CAPSULE | ORAL | 0 refills | Status: DC

## 2016-10-16 MED ORDER — LORAZEPAM 2 MG/ML IJ SOLN: 0.0000 mg | Freq: Two times a day (BID) | INTRAMUSCULAR | Status: DC

## 2016-10-16 MED ORDER — LORAZEPAM 2 MG/ML IJ SOLN
0.0000 mg | Freq: Four times a day (QID) | INTRAMUSCULAR | Status: DC
  Administered 2016-10-16: 1 mg via INTRAVENOUS
  Filled 2016-10-16: qty 1

## 2016-10-16 MED ORDER — SODIUM CHLORIDE 0.9 % IV BOLUS (SEPSIS)
1000.0000 mL | Freq: Once | INTRAVENOUS | Status: AC
Start: 2016-10-16 — End: 2016-10-16
  Administered 2016-10-16: 1000 mL via INTRAVENOUS

## 2016-10-16 MED ORDER — CHLORDIAZEPOXIDE HCL 25 MG PO CAPS
100.0000 mg | ORAL_CAPSULE | Freq: Once | ORAL | Status: AC
  Administered 2016-10-16: 100 mg via ORAL
  Filled 2016-10-16: qty 4

## 2016-10-16 MED ORDER — SODIUM CHLORIDE 0.9 % IV SOLN
Freq: Once | INTRAVENOUS | Status: AC
  Administered 2016-10-16: 10:00:00 via INTRAVENOUS

## 2016-10-16 MED ORDER — LORAZEPAM 2 MG/ML IJ SOLN
1.0000 mg | Freq: Once | INTRAMUSCULAR | Status: AC
  Administered 2016-10-16: 1 mg via INTRAVENOUS
  Filled 2016-10-16: qty 1

## 2016-10-16 MED ORDER — IOPAMIDOL (ISOVUE-370) INJECTION 76%
INTRAVENOUS | Status: AC
  Administered 2016-10-16: 100 mL
  Filled 2016-10-16: qty 100

## 2016-10-16 NOTE — ED Notes (Signed)
Gave EKG to Dr. Criss AlvineGoldston, MD.

## 2016-10-16 NOTE — ED Notes (Signed)
Unable to collect the labs patient is in xray

## 2016-10-16 NOTE — Discharge Instructions (Signed)
You have been seen today for palpitations. You're been prescribed a Librium taper, medication used to help with the symptoms of alcohol withdrawal. Please follow-up with gastroenterology as they will be able to further evaluate your elevated liver enzymes. The most likely cause for these is alcoholic hepatitis. The most important treatment for alcoholic hepatitis is to stop drinking alcohol.

## 2016-10-16 NOTE — ED Triage Notes (Signed)
Pt comes to ed via himself, was driving to work and then noticed his heart was not beating properly verbalized by pt. Pt became scared so he came to ed. Pt first noticed the irregular beats around 4 days ago.

## 2016-10-16 NOTE — ED Provider Notes (Signed)
[facility]-EMERGENCY DEPT Provider Note   CSN: [id number] Arrival date & time: 10/16/16  9562     History   Chief Complaint Chief Complaint  Patient presents with  . Irregular Heart Beat    verbalized     HPI Barry Harris is a 46 y.o. male.  HPI   Barry Harris is a 46 y.o. male, with a history of HTN and intermittent palpitations, presenting to the ED with palpitations, occurring for at least the last 2 years, increasing in frequency, intensity, and duration over the past 4 days. Episodes last for a few minutes. This morning, patient was driving to work when he began to feel the palpitations. Pt states they tend to stop if he relaxes himself with purposeful effort. Denies CP, SOB, abdominal pain, cough, N/V, recent illness, or any other complaints. No noted unintended weight loss.   Takes supplements Sam-e for mood and NAC for his liver. Has not been compliant with his HTN medication in over a year.      Past Medical History:  Diagnosis Date  . Hypertension     Patient Active Problem List   Diagnosis Date Noted  . Hypertriglyceridemia 12/04/2014  . Palpitations 11/30/2014  . Elevated transaminase level 11/30/2014  . Alcohol abuse 11/30/2014  . Hyponatremia 11/30/2014  . Dehydration   . Transaminitis   . GAD (generalized anxiety disorder) 06/12/2014  . Obesity 05/08/2014  . Hypertension     Past Surgical History:  Procedure Laterality Date  . SPINE SURGERY     46yo       Home Medications    Prior to Admission medications   Medication Sig Start Date End Date Taking? Authorizing Provider  ibuprofen (ADVIL,MOTRIN) 200 MG tablet Take 400 mg by mouth every 6 (six) hours as needed for headache, mild pain or moderate pain.   Yes Historical Provider, MD  Multiple Vitamin (MULTIVITAMIN) tablet Take 1 tablet by mouth daily.   Yes Historical Provider, MD  benazepril (LOTENSIN) 20 MG tablet TAKE 1 TABLET EVERY DAY Patient not taking: Reported on 10/16/2016  12/12/14   Salley Scarlet, MD  chlordiazePOXIDE (LIBRIUM) 25 MG capsule 50mg  PO TID x 1D, then 25-50mg  PO BID X 1D, then 25-50mg  PO QD X 1D 10/16/16   Anselm Pancoast, PA-C    Family History Family History  Problem Relation Age of Onset  . Cancer Father     prostate  . Asthma Sister   . Diabetes Sister   . Diabetes Maternal Grandmother   . Heart disease Maternal Grandmother   . Diabetes Mother   . Hypertension Mother     Social History Social History  Substance Use Topics  . Smoking status: Former Smoker    Quit date: 04/20/2001  . Smokeless tobacco: Never Used  . Alcohol use 1.5 oz/week    3 Standard drinks or equivalent per week     Comment: former drinker/ drinking problem /     Allergies   Patient has no known allergies.   Review of Systems Review of Systems  Constitutional: Negative for chills and fever.  Respiratory: Negative for cough and shortness of breath.   Cardiovascular: Positive for palpitations. Negative for chest pain.  Gastrointestinal: Negative for abdominal pain, nausea and vomiting.  All other systems reviewed and are negative.    Physical Exam Updated Vital Signs BP 163/96 (BP Location: Right Arm)   Pulse 107   Temp 98.5 F (36.9 C) (Oral)   Resp 20   SpO2 100%  Physical Exam  Constitutional: He appears well-developed and well-nourished. No distress.  HENT:  Head: Normocephalic and atraumatic.  Eyes: Conjunctivae are normal. Pupils are equal, round, and reactive to light.  Neck: Neck supple.  Cardiovascular: Regular rhythm, normal heart sounds and intact distal pulses.  Tachycardia present.   Pulmonary/Chest: Effort normal and breath sounds normal. No respiratory distress.  Abdominal: Soft. There is no tenderness. There is no guarding.  Musculoskeletal: He exhibits no edema.  Lymphadenopathy:    He has no cervical adenopathy.  Neurological: He is alert.  Skin: Skin is warm and dry. He is not diaphoretic.  Psychiatric: He has a normal  mood and affect. His behavior is normal.  Nursing note and vitals reviewed.    ED Treatments / Results  Labs (all labs ordered are listed, but only abnormal results are displayed) Labs Reviewed  CBC WITH DIFFERENTIAL/PLATELET - Abnormal; Notable for the following:       Result Value   HCT 37.7 (*)    MCHC 36.3 (*)    Platelets 126 (*)    All other components within normal limits  COMPREHENSIVE METABOLIC PANEL - Abnormal; Notable for the following:    Glucose, Bld 130 (*)    BUN 5 (*)    Albumin 3.4 (*)    AST 550 (*)    ALT 308 (*)    Total Bilirubin 2.4 (*)    All other components within normal limits  COMPREHENSIVE METABOLIC PANEL - Abnormal; Notable for the following:    Sodium 133 (*)    CO2 18 (*)    Glucose, Bld 128 (*)    Calcium 8.6 (*)    Albumin 3.1 (*)    AST 493 (*)    ALT 284 (*)    Total Bilirubin 1.8 (*)    All other components within normal limits  I-STAT CHEM 8, ED - Abnormal; Notable for the following:    Glucose, Bld 133 (*)    Calcium, Ion 1.05 (*)    All other components within normal limits  URINALYSIS, ROUTINE W REFLEX MICROSCOPIC  TSH  HEPATITIS PANEL, ACUTE  I-STAT TROPOININ, ED   ALT  Date Value Ref Range Status  10/16/2016 284 (H) 17 - 63 U/L Final  10/16/2016 308 (H) 17 - 63 U/L Final  12/10/2014 79 (H) 0 - 53 U/L Final  12/04/2014 300 (H) 0 - 53 U/L Final    AST  Date Value Ref Range Status  10/16/2016 493 (H) 15 - 41 U/L Final  10/16/2016 550 (H) 15 - 41 U/L Final  12/10/2014 43 (H) 0 - 37 U/L Final  12/04/2014 96 (H) 0 - 37 U/L Final       EKG  EKG Interpretation  Date/Time:  Friday October 16 2016 06:40:23 EST Ventricular Rate:  106 PR Interval:    QRS Duration: 95 QT Interval:  345 QTC Calculation: 459 R Axis:   48 Text Interpretation:  Sinus tachycardia no significant change since 2016 Confirmed by GOLDSTON MD, SCOTT [phone]) on 10/16/2016 6:46:52 AM       Radiology Dg Chest 2 View  Result Date:  10/16/2016 CLINICAL DATA:  Palpitations EXAM: CHEST  2 VIEW COMPARISON:  11/30/2014 chest radiograph. FINDINGS: Stable cardiomediastinal silhouette with normal heart size. No pneumothorax. No pleural effusion. Lungs appear clear, with no acute consolidative airspace disease and no pulmonary edema. IMPRESSION: No active cardiopulmonary disease. Electronically Signed   By: Delbert Phenix M.D.   On: 10/16/2016 07:51   Ct Angio Chest  Pe W Or Wo Contrast  Result Date: 10/16/2016 CLINICAL DATA:  Palpitations, tachycardia EXAM: CT ANGIOGRAPHY CHEST WITH CONTRAST TECHNIQUE: Multidetector CT imaging of the chest was performed using the standard protocol during bolus administration of intravenous contrast. Multiplanar CT image reconstructions and MIPs were obtained to evaluate the vascular anatomy. CONTRAST:  100 cc Isovue 370 IV COMPARISON:  Chest x-ray today FINDINGS: Cardiovascular: No filling defects in the pulmonary arteries to suggest pulmonary emboli. Insert Heart Mediastinum/Nodes: No mediastinal, hilar, or axillary adenopathy. Lungs/Pleura: Lungs are clear. No focal airspace opacities or suspicious nodules. No effusions. Upper Abdomen: Diffuse fatty infiltration of the liver. No acute findings in the upper abdomen. Musculoskeletal: Chest wall soft tissues are unremarkable. No acute bony abnormality or focal bone lesion. Review of the MIP images confirms the above findings. IMPRESSION: No evidence of pulmonary embolus. No acute cardiopulmonary disease. Electronically Signed   By: Charlett Nose M.D.   On: 10/16/2016 11:02    Procedures Procedures (including critical care time)  Medications Ordered in ED Medications  LORazepam (ATIVAN) injection 0-4 mg (1 mg Intravenous Given 10/16/16 1159)    Followed by  LORazepam (ATIVAN) injection 0-4 mg (not administered)  sodium chloride 0.9 % bolus 1,000 mL (0 mLs Intravenous Stopped 10/16/16 1000)  0.9 %  sodium chloride infusion ( Intravenous New Bag/Given 10/16/16 1000)   iopamidol (ISOVUE-370) 76 % injection (100 mLs  Contrast Given 10/16/16 1043)  chlordiazePOXIDE (LIBRIUM) capsule 100 mg (100 mg Oral Given 10/16/16 1417)  LORazepam (ATIVAN) injection 1 mg (1 mg Intravenous Given 10/16/16 1417)     Initial Impression / Assessment and Plan / ED Course  I have reviewed the triage vital signs and the nursing notes.  Pertinent labs & imaging results that were available during my care of the patient were reviewed by me and considered in my medical decision making (see chart for details).  Clinical Course     Patient presents with tachycardia and palpitations. CT PE study ordered due to unexplained, sustained tachycardia and patient's recurrent palpitations.  9:51 AM CMP results were noted to be delayed. Lab was called. They state the patient's sample was lipemic and will need to be sent to [facility]. Ordered redraw of CMP and addition of chem 8 for creatinine clearance to allow for CT PE study.  In the search for reasons for the patient's continued tachycardia, patient finally revealed that he has been still drinking alcohol, but not as much as he used to. Pt had previously told me that he "used to be a big drinker, but not anymore." Pt states he drinks 3-4 shots of liquor about 4 days a week. Last alcohol intake was two days ago.   Patient's tachycardia is suspected to be from minor symptoms of withdrawal. Ativan here in the ED and Librium taper outpatient. Liver enzyme elevations are suspected to be due to the patient's alcoholism. Patient does not appear to be symptomatic at this time. GI follow-up. Pt voiced improvement in symptoms with the ativan. Tachycardia resolved upon my evaluation prior to discharge. Return precautions discussed. Pt voiced understanding of all instructions and is comfortable with discharge.  Findings and plan of care discussed with Melene Plan, DO.    Vitals:   10/16/16 0655 10/16/16 0700 10/16/16 0730 10/16/16 0945  BP:  156/88  158/97 154/96  Pulse:  101 104 108  Resp:  13 20 25   Temp:      TempSrc:      SpO2: 100% 99% 100% 100%  Vitals:   10/16/16 0945 10/16/16 1000 10/16/16 1030 10/16/16 1130  BP: 154/96 167/88 154/99 159/96  Pulse: 108 105 104 102  Resp: 25 16 21 18   Temp:      TempSrc:      SpO2: 100% 98% 99% 97%     Final Clinical Impressions(s) / ED Diagnoses   Final diagnoses:  Palpitations  Elevated liver enzymes    New Prescriptions New Prescriptions   CHLORDIAZEPOXIDE (LIBRIUM) 25 MG CAPSULE    50mg  PO TID x 1D, then 25-50mg  PO BID X 1D, then 25-50mg  PO QD X 1D     Anselm PancoastShawn C Lelaina Oatis, PA-C 10/16/16 1521    Melene Planan Floyd, DO 10/16/16 1522

## 2016-10-17 LAB — HEPATITIS PANEL, ACUTE
HCV Ab: <0.1 s/co ratio (ref 0.0–0.9)
HEP B S AG: NEGATIVE
Hep A IgM: NEGATIVE
Hep B C IgM: NEGATIVE

## 2016-10-19 ENCOUNTER — Encounter: Payer: Self-pay | Admitting: Physician Assistant

## 2016-10-27 ENCOUNTER — Other Ambulatory Visit (INDEPENDENT_AMBULATORY_CARE_PROVIDER_SITE_OTHER): Payer: BLUE CROSS/BLUE SHIELD

## 2016-10-27 ENCOUNTER — Ambulatory Visit (INDEPENDENT_AMBULATORY_CARE_PROVIDER_SITE_OTHER): Payer: BLUE CROSS/BLUE SHIELD | Admitting: Physician Assistant

## 2016-10-27 ENCOUNTER — Encounter: Payer: Self-pay | Admitting: Physician Assistant

## 2016-10-27 VITALS — BP 132/88 | HR 74 | Ht 72.0 in | Wt 273.0 lb

## 2016-10-27 DIAGNOSIS — R7989 Other specified abnormal findings of blood chemistry: Secondary | ICD-10-CM | POA: Diagnosis not present

## 2016-10-27 DIAGNOSIS — R945 Abnormal results of liver function studies: Principal | ICD-10-CM

## 2016-10-27 DIAGNOSIS — K760 Fatty (change of) liver, not elsewhere classified: Secondary | ICD-10-CM | POA: Diagnosis not present

## 2016-10-27 LAB — PROTIME-INR
INR: 1.1 ratio — AB (ref 0.8–1.0)
PROTHROMBIN TIME: 11.9 s (ref 9.6–13.1)

## 2016-10-27 LAB — IBC PANEL
Iron: 102 ug/dL (ref 42–165)
Saturation Ratios: 23.1 % (ref 20.0–50.0)
TRANSFERRIN: 315 mg/dL (ref 212.0–360.0)

## 2016-10-27 LAB — HEPATIC FUNCTION PANEL
ALK PHOS: 78 U/L (ref 39–117)
ALT: 60 U/L — AB (ref 0–53)
AST: 68 U/L — AB (ref 0–37)
Albumin: 3.9 g/dL (ref 3.5–5.2)
BILIRUBIN DIRECT: 0.2 mg/dL (ref 0.0–0.3)
BILIRUBIN TOTAL: 0.7 mg/dL (ref 0.2–1.2)
TOTAL PROTEIN: 7.8 g/dL (ref 6.0–8.3)

## 2016-10-27 LAB — FERRITIN: Ferritin: 503.2 ng/mL — ABNORMAL HIGH (ref 22.0–322.0)

## 2016-10-27 NOTE — Progress Notes (Signed)
Agree with initial assessment and plans as outlined. Barry Harris, please have the patient follow-up in your clinic

## 2016-10-27 NOTE — Patient Instructions (Signed)
Your physician has requested that you go to the basement for lab work before leaving today.  Abstain from alcohol use.

## 2016-10-27 NOTE — Progress Notes (Signed)
Chief Complaint: Elevated liver function tests  HPI:  Mr. Barry Harris is a 46 year old African-American male with a past medical history of elevated liver enzymes and hypertension, who was referred to me after being seen in the ED recently, for a chief complaint of elevated liver enzymes. Patient has never been seen in our clinic previously.   Per chart review it appears patient was recently seen in the ED on 10/16/16 with palpitations. At that time he had labs showing an AST elevated at 493, ALT elevated at 284 and a total bilirubin elevated at 1.8. Acute hepatitis panel was negative. Patient also had a CT angio of the chest with and without contrast on 10/16/2016 which showed no evidence of pulmonary embolus and no acute cardiopulmonary disease. It did show diffuse fatty infiltration of the liver. It was thought the patient's symptoms were related to his alcohol use at that time.   Per further review it appears patient was also seen in the hospital back in February 2016 for dehydration at that time was found to have elevated liver enzymes. His ferritin at that time was significantly elevated at 2010. He was diagnosed with acute hepatitis thought likely due to his alcohol use and herbal supplements. ASMA and AMA could not be done due to the sample being lipemic at the time, HIV was nonreactive.   Today, the patient tells me that he did stop drinking and using his supplements for about 6-8 months after his visit to the hospital in 2016, but then "gradually slipped into my old ways". The patient tells me he started hanging out with his old friends and started drinking again. Currently, he has at least 3-4 alcoholic drinks a day. Patient does describe some right upper quadrant discomfort at times, but this is more of a "realization of fullness" than anything else. The patient is aware that his habits could lead to cirrhosis. Patient describes some excessive thirst and fatigue.   Patient denies family history of  liver disease, history of IV drug use, blood transfusions, or previous diagnosis of hepatitis.   Patient denies fever, chills, blood in his stool, melena, change in bowel habits, weight loss, anorexia, nausea, vomiting, heartburn or reflux.   Past Medical History:  Diagnosis Date  . Hypertension     Past Surgical History:  Procedure Laterality Date  . SPINE SURGERY     46yo    Current Outpatient Prescriptions  Medication Sig Dispense Refill  . ibuprofen (ADVIL,MOTRIN) 200 MG tablet Take 400 mg by mouth every 6 (six) hours as needed for headache, mild pain or moderate pain.    . Multiple Vitamin (MULTIVITAMIN) tablet Take 1 tablet by mouth daily.    . S-Adenosylmethionine (SAM-E) 200 MG TABS Take by mouth daily.     No current facility-administered medications for this visit.     Allergies as of 10/27/2016  . (No Known Allergies)    Family History  Problem Relation Age of Onset  . Cancer Father     prostate  . Asthma Sister   . Diabetes Sister   . Diabetes Maternal Grandmother   . Heart disease Maternal Grandmother   . Diabetes Mother   . Hypertension Mother   . Breast cancer Sister     Social History   Social History  . Marital status: Single    Spouse name: N/A  . Number of children: N/A  . Years of education: N/A   Occupational History  . [employer]   Social History Main  Topics  . Smoking status: Former Smoker    Quit date: 04/20/2001  . Smokeless tobacco: Never Used  . Alcohol use 1.5 oz/week    3 Standard drinks or equivalent per week     Comment: former drinker/ drinking problem /  . Drug use: No  . Sexual activity: Not Currently   Other Topics Concern  . 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       Exercise: Lifts weights some. Rides stationary bike 2-3 times per week -but does this in spurts.                    Does not consistently  exercise and stay on routine.      Diet: Fairly careful with what he eats.    Review of Systems:     Constitutional: Positive for fatigue No weight loss, fever or chills HEENT: Eyes: No change in vision               Ears, Nose, Throat:  No change in hearing  Skin: Positive for itching No rash  Cardiovascular: Positive for heart rhythm changes No chest pain Respiratory: No SOB Gastrointestinal: See HPI and otherwise negative Neurological: No headache Musculoskeletal: Positive for chronic back pain and arthritis  Hematologic: No bleeding  Psychiatric: Positive for depression and anxiety    Physical Exam:  Vital signs: BP 132/88   Pulse 74   Ht 6' (1.829 m)   Wt 273 lb (123.8 kg)   BMI 37.03 kg/m   Constitutional:   Pleasant Overweight African-American male appears to be in NAD, Well developed, Well nourished, alert and cooperative Head:  Normocephalic and atraumatic. Eyes:   PEERL, EOMI. No icterus. Conjunctiva pink. Ears:  Normal auditory acuity. Neck:  Supple Throat: Oral cavity and pharynx without inflammation, swelling or lesion.  Respiratory: Respirations even and unlabored. Lungs clear to auscultation bilaterally.   No wheezes, crackles, or rhonchi.  Cardiovascular: Normal S1, S2. No MRG. Regular rate and rhythm. No peripheral edema, cyanosis or pallor.  Gastrointestinal:  Soft, nondistended, nontender. No rebound or guarding. Normal bowel sounds. No appreciable masses or hepatomegaly. Rectal:  Not performed.  Msk:  Symmetrical without gross deformities. Without edema, no deformity or joint abnormality.  Neurologic:  Alert and  oriented x4;  grossly normal neurologically.  Skin:   Dry and intact without significant lesions or rashes. Psychiatric: Demonstrates good judgement and reason without abnormal affect or behaviors.  RELEVANT LABS AND IMAGING: CBC    Component Value Date/Time   WBC 4.9 10/16/2016 0753   RBC 4.33 10/16/2016 0753   HGB 13.9 10/16/2016 1014    HCT 41.0 10/16/2016 1014   PLT 126 (L) 10/16/2016 0753   MCV 87.1 10/16/2016 0753   MCH 31.6 10/16/2016 0753   MCHC 36.3 (H) 10/16/2016 0753   RDW 13.5 10/16/2016 0753   LYMPHSABS 1.1 10/16/2016 0753   MONOABS 0.4 10/16/2016 0753   EOSABS 0.0 10/16/2016 0753   BASOSABS 0.1 10/16/2016 0753    CMP     Component Value Date/Time   NA 135 10/16/2016 1014   K 4.2 10/16/2016 1014   CL 104 10/16/2016 1014   CO2 18 (L) 10/16/2016 1005   GLUCOSE 133 (H) 10/16/2016 1014   BUN 7 10/16/2016 1014   CREATININE 0.80 10/16/2016 1014   CREATININE 0.84 12/10/2014 0821   CALCIUM 8.6 (L) 10/16/2016  1005   PROT 6.8 10/16/2016 1005   ALBUMIN 3.1 (L) 10/16/2016 1005   AST 493 (H) 10/16/2016 1005   ALT 284 (H) 10/16/2016 1005   ALKPHOS 118 10/16/2016 1005   BILITOT 1.8 (H) 10/16/2016 1005   GFRNONAA >60 10/16/2016 1005   GFRNONAA >89 12/10/2014 0821   GFRAA >60 10/16/2016 1005   GFRAA >89 12/10/2014 0821    Assessment: 1. Elevated liver enzymes: Apparently these have been elevated for over 2 years, patient was hospitalized for this back in 2016, labs at that time were suggestive of alcohol/supplement liver damage versus a possible hemachromatosis with an elevated ferritin. This does not appear to have been worked up any further. Patient did discontinue alcohol for a period of 6-8 months as well as supplements. He restarted a year and half ago and currently drinks 3-4 drinks per day. Likely this continues to be the major abusive agent causing fatty liver as well as a elevated liver enzymes, but we will check for other possibilities today. 2. Fatty infiltration of the liver: No recent imaging, worsened over the past 2 years, likely related to etoh use  Plan: 1. We had a long discussion today about the patient's alcohol use. I urged alcohol cessation with him. We did discuss if he thought he would could benefit from counseling and/or rehabilitation and the patient would like to try quitting by himself.  He is aware of the damage that his alcohol use is doing to his liver and how this could worsen with time. 2. Recommend the patient remain abstinent from substances which could also affect his liver such as supplements. 3. Recommend no more than 2 g of Tylenol per day 4. We will repeat labs today to consider other causes of liver disease including ASMA, AMA, ANA, alpha-1 antitrypsin, ceruloplasmin, PT/INR and iron studies. Recommend recheck of liver enzymes in 3-4 weeks. 5. Patient to follow in clinic in 3-4 weeks or sooner if necessary with myself or Dr. Marina Goodell who is the supervising physician this morning  Hyacinth Meeker, PA-C [facility] 10/27/2016, 11:23 AM  Cc: No ref. provider found

## 2016-10-28 LAB — ANA: ANA: NEGATIVE

## 2016-10-29 LAB — ALPHA-1-ANTITRYPSIN: A1 ANTITRYPSIN SER: 181 mg/dL (ref 83–199)

## 2016-10-29 LAB — CERULOPLASMIN: CERULOPLASMIN: 28 mg/dL (ref 18–36)

## 2016-11-04 ENCOUNTER — Other Ambulatory Visit: Payer: Self-pay

## 2016-11-04 DIAGNOSIS — R7989 Other specified abnormal findings of blood chemistry: Secondary | ICD-10-CM

## 2016-11-04 DIAGNOSIS — K760 Fatty (change of) liver, not elsewhere classified: Secondary | ICD-10-CM

## 2016-11-04 DIAGNOSIS — R945 Abnormal results of liver function studies: Principal | ICD-10-CM

## 2016-11-27 ENCOUNTER — Ambulatory Visit: Payer: BLUE CROSS/BLUE SHIELD | Admitting: Physician Assistant

## 2016-12-21 ENCOUNTER — Encounter: Payer: Self-pay | Admitting: Physician Assistant

## 2016-12-30 ENCOUNTER — Other Ambulatory Visit (INDEPENDENT_AMBULATORY_CARE_PROVIDER_SITE_OTHER): Payer: BLUE CROSS/BLUE SHIELD

## 2016-12-30 ENCOUNTER — Encounter: Payer: Self-pay | Admitting: Physician Assistant

## 2016-12-30 ENCOUNTER — Ambulatory Visit (INDEPENDENT_AMBULATORY_CARE_PROVIDER_SITE_OTHER): Payer: BLUE CROSS/BLUE SHIELD | Admitting: Physician Assistant

## 2016-12-30 VITALS — BP 154/90 | HR 96 | Ht 70.5 in | Wt 275.0 lb

## 2016-12-30 DIAGNOSIS — R748 Abnormal levels of other serum enzymes: Secondary | ICD-10-CM

## 2016-12-30 DIAGNOSIS — R7989 Other specified abnormal findings of blood chemistry: Secondary | ICD-10-CM

## 2016-12-30 DIAGNOSIS — R1011 Right upper quadrant pain: Secondary | ICD-10-CM | POA: Diagnosis not present

## 2016-12-30 DIAGNOSIS — R945 Abnormal results of liver function studies: Principal | ICD-10-CM

## 2016-12-30 DIAGNOSIS — K760 Fatty (change of) liver, not elsewhere classified: Secondary | ICD-10-CM

## 2016-12-30 LAB — HEPATIC FUNCTION PANEL
ALBUMIN: 3.8 g/dL (ref 3.5–5.2)
ALK PHOS: 89 U/L (ref 39–117)
ALT: 76 U/L — AB (ref 0–53)
AST: 77 U/L — AB (ref 0–37)
Bilirubin, Direct: 0.2 mg/dL (ref 0.0–0.3)
TOTAL PROTEIN: 7.7 g/dL (ref 6.0–8.3)
Total Bilirubin: 0.8 mg/dL (ref 0.2–1.2)

## 2016-12-30 LAB — FERRITIN: Ferritin: 538.5 ng/mL — ABNORMAL HIGH (ref 22.0–322.0)

## 2016-12-30 NOTE — Patient Instructions (Addendum)
Your physician has requested that you go to the basement for lab work before leaving today.  Continue to decrease alcohol intake.

## 2016-12-30 NOTE — Progress Notes (Signed)
Chief Complaint: Elevated LFT's, RUQ pain  HPI:  Barry Harris is a 46 year old African-American male with a past medical history of elevated liver enzymes and hypertension, who returns to clinic today for follow-up of his elevated liver enzymes and a complaint of right upper quadrant pain.     Please recall patient was last seen in clinic on 10/27/16 for the same complaint by me and was assigned to Dr. Marina Goodell. At that time the patient had recent labs showing an AST elevated at 493 and ALT elevated at 284 as well as a total bilirubin elevated at 1.8. Patient had a CT Angio of the chest with and without contrast which showed diffuse fatty infiltration of the liver. At that time, the patient's symptoms were thought related to his alcohol use. Per review of chart at that time it, his ferritin had been significantly elevated in 2016 and again was thought due to his alcohol use. At time of last visit patient tells me that he "slipped into his old ways", and had continued drinking 3-4 alcoholic drinks a day. He also described some infrequent right upper quadrant discomfort. At that time further labs are ordered to rule out other causes of liver disease including ASMA, ANA, AMA, alpha-1 antitrypsin, ceruloplasmin, PT/INR and iron studies. All returned normal other than a ferritin elevated at 503.2. Liver enzymes on that day showed a decrease in AST to 68 and ALT  to 60.   Today, patient returns to clinic and tells me that he has decreased his drinking some but has not quit altogether. He also tells me he has not lost any weight since being seen last. He is aware of the  recommendations to stop drinking and for a slow and steady weight loss as discussed at time of last appointment but has not been doing this. He continues with a right upper quadrant discomfort which lasts for 2-3 seconds and feels like "gas passing".   Patient denies fever, chills, blood in the stool, melena, weight loss, fatigue, anorexia, nausea,  vomiting, heartburn, reflux or change in bowel habits.  Past Medical History:  Diagnosis Date  . Hypertension     Past Surgical History:  Procedure Laterality Date  . SPINE SURGERY     46yo    Current Outpatient Prescriptions  Medication Sig Dispense Refill  . ibuprofen (ADVIL,MOTRIN) 200 MG tablet Take 400 mg by mouth every 6 (six) hours as needed for headache, mild pain or moderate pain.    . Multiple Vitamin (MULTIVITAMIN) tablet Take 1 tablet by mouth daily.    . S-Adenosylmethionine (SAM-E) 200 MG TABS Take by mouth daily.     No current facility-administered medications for this visit.     Allergies as of 12/30/2016  . (No Known Allergies)    Family History  Problem Relation Age of Onset  . Cancer Father     prostate  . Asthma Sister   . Diabetes Sister   . Diabetes Maternal Grandmother   . Heart disease Maternal Grandmother   . Diabetes Mother   . Hypertension Mother   . Breast cancer Sister     Social History   Social History  . Marital status: Single    Spouse name: N/A  . Number of children: N/A  . Years of education: N/A   Occupational History  . [employer]   Social History Main Topics  . Smoking status: Former Smoker    Quit date: 04/20/2001  . Smokeless tobacco: Never Used  .  Alcohol use 1.5 oz/week    3 Standard drinks or equivalent per week     Comment: former drinker/ drinking problem /  . Drug use: No  . Sexual activity: Not Currently   Other Topics Concern  . 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       Exercise: Lifts weights some. Rides stationary bike 2-3 times per week -but does this in spurts.                    Does not consistently exercise and stay on routine.      Diet: Fairly careful with what he eats.    Review of Systems:    Constitutional: No weight loss, fever or chills Cardiovascular: No chest pain, chest  pressure or palpitations   Respiratory: No SOB  Gastrointestinal: See HPI and otherwise negative   Physical Exam:  Vital signs: BP (!) 154/90 (BP Location: Left Arm, Patient Position: Sitting, Cuff Size: Large)   Pulse 96   Ht 5' 10.5" (1.791 m) Comment: height measured without shoes  Wt 275 lb (124.7 kg)   BMI 38.90 kg/m   Constitutional:   Pleasant Obese African American male appears to be in NAD, Well developed, Well nourished, alert and cooperative Respiratory: Respirations even and unlabored. Lungs clear to auscultation bilaterally.   No wheezes, crackles, or rhonchi.  Cardiovascular: Normal S1, S2. No MRG. Regular rate and rhythm. No peripheral edema, cyanosis or pallor.  Gastrointestinal:  Soft, nondistended, nontender. No rebound or guarding. Normal bowel sounds. No appreciable masses or hepatomegaly. Psychiatric:  Demonstrates good judgement and reason without abnormal affect or behaviors.  RECENT LABS: CBC    Component Value Date/Time   WBC 4.9 10/16/2016 0753   RBC 4.33 10/16/2016 0753   HGB 13.9 10/16/2016 1014   HCT 41.0 10/16/2016 1014   PLT 126 (L) 10/16/2016 0753   MCV 87.1 10/16/2016 0753   MCH 31.6 10/16/2016 0753   MCHC 36.3 (H) 10/16/2016 0753   RDW 13.5 10/16/2016 0753   LYMPHSABS 1.1 10/16/2016 0753   MONOABS 0.4 10/16/2016 0753   EOSABS 0.0 10/16/2016 0753   BASOSABS 0.1 10/16/2016 0753    CMP     Component Value Date/Time   NA 135 10/16/2016 1014   K 4.2 10/16/2016 1014   CL 104 10/16/2016 1014   CO2 18 (L) 10/16/2016 1005   GLUCOSE 133 (H) 10/16/2016 1014   BUN 7 10/16/2016 1014   CREATININE 0.80 10/16/2016 1014   CREATININE 0.84 12/10/2014 0821   CALCIUM 8.6 (L) 10/16/2016 1005   PROT 7.8 10/27/2016 1116   ALBUMIN 3.9 10/27/2016 1116   AST 68 (H) 10/27/2016 1116   ALT 60 (H) 10/27/2016 1116   ALKPHOS 78 10/27/2016 1116   BILITOT 0.7 10/27/2016 1116   GFRNONAA >60 10/16/2016 1005   GFRNONAA >89 12/10/2014 0821   GFRAA >60 10/16/2016  1005   GFRAA >89 12/10/2014 0821    Assessment: 1. Elevated LFTs: Last checked 10/27/16 AST 68, ALT 60, this was significant decrease from ER visit earlier in January, ferritin was continually increased in the 500s, HFE gene testing was ordered but patient never came in to complete this, it does not sound as though he has been abiding by recommendations at time of last office visit including alcohol cessation and weight loss; again likely patient's elevated liver enzymes are from fatty liver  related to his alcohol use, but will consider hemachromatosis with his elevated ferritin 2. Right upper quadrant discomfort: Continues, very intermittent, last one to 2 seconds, "doesn't really bother me"; Consider relation to IBS versus gas versus other  Plan: 1. Ordered a recheck of LFTs as well as HFE gene testing and recheck of ferritin 2. Continued to encourage the patient to avoid alcohol as well as limited other hepatotoxic substances such as supplements 3. Encouraged the patient to start a slow and steady weight loss of 1-2 pounds per week 4. Patient to return to clinic per recommendations after labs above  Barry MeekerJennifer Renardo Cheatum, PA-C [facility] 12/30/2016, 2:58 PM  Cc: No ref. provider found

## 2016-12-31 NOTE — Progress Notes (Signed)
Victorino DikeJennifer, Agree. Needs to stop all alcohol and loose weight. Have him see you in 6 months. Thanks

## 2017-01-01 LAB — ANTI-SMOOTH MUSCLE ANTIBODY, IGG

## 2017-01-01 LAB — MITOCHONDRIAL ANTIBODIES: Mitochondrial M2 Ab, IgG: <=20 Units (ref <=20.0)

## 2017-01-05 LAB — HEMOCHROMATOSIS DNA-PCR(C282Y,H63D)

## 2017-01-18 ENCOUNTER — Encounter: Payer: Self-pay | Admitting: Physician Assistant

## 2017-01-22 ENCOUNTER — Ambulatory Visit (INDEPENDENT_AMBULATORY_CARE_PROVIDER_SITE_OTHER): Payer: BLUE CROSS/BLUE SHIELD | Admitting: Adult Health

## 2017-01-22 ENCOUNTER — Encounter: Payer: Self-pay | Admitting: Adult Health

## 2017-01-22 VITALS — BP 150/82 | Temp 98.1°F | Ht 70.5 in | Wt 275.9 lb

## 2017-01-22 DIAGNOSIS — I10 Essential (primary) hypertension: Secondary | ICD-10-CM | POA: Diagnosis not present

## 2017-01-22 DIAGNOSIS — Z7689 Persons encountering health services in other specified circumstances: Secondary | ICD-10-CM | POA: Diagnosis not present

## 2017-01-22 DIAGNOSIS — F1010 Alcohol abuse, uncomplicated: Secondary | ICD-10-CM | POA: Diagnosis not present

## 2017-01-22 MED ORDER — LOSARTAN POTASSIUM 50 MG PO TABS
50.0000 mg | ORAL_TABLET | Freq: Every day | ORAL | 1 refills | Status: DC
Start: 2017-01-22 — End: 2017-03-16

## 2017-01-22 NOTE — Progress Notes (Signed)
Patient presents to clinic today to establish care. He is a pleasant 46 year old male who  has a past medical history of Alcohol abuse; Elevated liver enzymes; and Hypertension.   Acute Concerns: Establish Care   Chronic Issues: Elevated Liver Enzymes - Has been seen [facility] in the past. Patient had a CT Angio of the chest with and without contrast which showed diffuse fatty infiltration of the liver. At that time, the patient's symptoms were thought related to his alcohol use.   Hypertension - Has been a chronic issue. Has been on multiple medications in the past and stopped taking it due to side effects.   Alcohol Abuse - Drinks about a pint of vodka a day. He can go 2-3 days without any alcohol but then goes through withdraw  Health Maintenance: Dental -- Does not do routine care Vision -- Does not do routine care  Immunization: UTD - Believes he had a tdap 5 years ago  Colonoscopy -- Never had  Diet: He tries to eat healthy - vegetables. Tries not to eat red meats Exercise: Does not exercise on a regular basis      Past Medical History:  Diagnosis Date  . Alcohol abuse   . Elevated liver enzymes   . Hypertension     Past Surgical History:  Procedure Laterality Date  . SPINE SURGERY     46yo    Current Outpatient Prescriptions on File Prior to Visit  Medication Sig Dispense Refill  . Multiple Vitamin (MULTIVITAMIN) tablet Take 1 tablet by mouth daily.    . S-Adenosylmethionine (SAM-E) 200 MG TABS Take by mouth daily.     No current facility-administered medications on file prior to visit.     No Known Allergies  Family History  Problem Relation Age of Onset  . Cancer Father     prostate  . Asthma Sister   . Diabetes Sister   . Diabetes Maternal Grandmother   . Heart disease Maternal Grandmother   . Diabetes Mother   . Hypertension Mother   . Breast cancer Sister     Social History   Social History  . Marital status: Single    Spouse name: N/A    . Number of children: N/A  . Years of education: N/A   Occupational History  . [employer]   Social History Main Topics  . Smoking status: Former Smoker    Quit date: 04/20/2001  . Smokeless tobacco: Never Used  . Alcohol use 1.5 oz/week    3 Standard drinks or equivalent per week     Comment: former drinker/ drinking problem /  . Drug use: No  . Sexual activity: Not Currently   Other Topics Concern  . 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       Exercise: Lifts weights some. Rides stationary bike 2-3 times per week -but does this in spurts.                    Does not consistently exercise and stay on routine.      Diet: Fairly careful with what he eats.    Review of Systems  Constitutional: Negative.   HENT: Negative.   Eyes: Negative.   Respiratory: Negative.   Cardiovascular: Negative.   Gastrointestinal: Negative.   Genitourinary: Negative.   Musculoskeletal: Negative.   Skin:  Negative.   Neurological: Negative.   Endo/Heme/Allergies: Negative.   Psychiatric/Behavioral: Negative.     BP (!) 150/82 (BP Location: Left Arm, Patient Position: Sitting, Cuff Size: Normal)   Temp 98.1 F (36.7 C) (Oral)   Ht 5' 10.5" (1.791 m)   Wt 275 lb 14.4 oz (125.1 kg)   BMI 39.03 kg/m   Physical Exam  Constitutional: He is oriented to person, place, and time and well-developed, well-nourished, and in no distress. No distress.  Cardiovascular: Normal rate, regular rhythm, normal heart sounds and intact distal pulses.  Exam reveals no gallop and no friction rub.   No murmur heard. Pulmonary/Chest: Effort normal and breath sounds normal. No respiratory distress. He has no wheezes. He has no rales. He exhibits no tenderness.  Abdominal: Soft. Bowel sounds are normal. He exhibits no distension and no mass. There is no tenderness. There is no rebound and no guarding.   Neurological: He is alert and oriented to person, place, and time. Gait normal. GCS score is 15.  Skin: Skin is warm and dry. No rash noted. He is not diaphoretic. No erythema. No pallor.  Psychiatric: Mood, memory, affect and judgment normal.  Nursing note and vitals reviewed.   Recent Results (from the past 2160 hour(s))  Hepatic function panel     Status: Abnormal   Collection Time: 10/27/16 11:16 AM  Result Value Ref Range   Total Bilirubin 0.7 0.2 - 1.2 mg/dL   Bilirubin, Direct 0.2 0.0 - 0.3 mg/dL   Alkaline Phosphatase 78 39 - 117 U/L   AST 68 (H) 0 - 37 U/L   ALT 60 (H) 0 - 53 U/L   Total Protein 7.8 6.0 - 8.3 g/dL   Albumin 3.9 3.5 - 5.2 g/dL  IBC panel     Status: None   Collection Time: 10/27/16 11:16 AM  Result Value Ref Range   Iron 102 42 - 165 ug/dL   Transferrin 161.0 960.4 - 360.0 mg/dL   Saturation Ratios 54.0 20.0 - 50.0 %  Ferritin     Status: Abnormal   Collection Time: 10/27/16 11:16 AM  Result Value Ref Range   Ferritin 503.2 (H) 22.0 - 322.0 ng/mL  INR/PT     Status: Abnormal   Collection Time: 10/27/16 11:16 AM  Result Value Ref Range   INR 1.1 (H) 0.8 - 1.0 ratio   Prothrombin Time 11.9 9.6 - 13.1 sec  Alpha-1-antitrypsin     Status: None   Collection Time: 10/27/16 11:16 AM  Result Value Ref Range   A-1 Antitrypsin, Ser 181 83 - 199 mg/dL  ANA     Status: None   Collection Time: 10/27/16 11:16 AM  Result Value Ref Range   Anit Nuclear Antibody(ANA) NEG NEGATIVE  Ceruloplasmin     Status: None   Collection Time: 10/27/16 11:16 AM  Result Value Ref Range   Ceruloplasmin 28 18 - 36 mg/dL  Hepatic function panel     Status: Abnormal   Collection Time: 12/30/16  3:14 PM  Result Value Ref Range   Total Bilirubin 0.8 0.2 - 1.2 mg/dL   Bilirubin, Direct 0.2 0.0 - 0.3 mg/dL   Alkaline Phosphatase 89 39 - 117 U/L   AST 77 (H) 0 - 37 U/L   ALT 76 (H) 0 - 53 U/L   Total Protein 7.7 6.0 - 8.3 g/dL   Albumin 3.8 3.5 - 5.2 g/dL  Ferritin     Status:  Abnormal   Collection Time: 12/30/16  3:14 PM  Result Value Ref Range   Ferritin 538.5 (H) 22.0 - 322.0 ng/mL  Mitochondrial Antibodies     Status: None   Collection Time: 12/30/16  3:14 PM  Result Value Ref Range   Mitochondrial M2 Ab, IgG <=20.0 <=20.0 Units    Comment:                                  Reference Range:                                  Negative:  < or = 20.0                                  Equivocal: 20.1 - 24.9                                  Positive:  > or = 25.0 ** Please note change in reference range(s). **     Anti-Smooth Muscle Antibody, IGG     Status: None   Collection Time: 12/30/16  3:14 PM  Result Value Ref Range   Smooth Muscle Ab <20 <20 U    Comment:   Reference Range:      < 20   Negative > or = 20   Positive   Antibodies recognizing actin are the main component of smooth muscle antibodies associated with autoimmune liver disease. Actin antibodies are found in approximately 75% of patients with autoimmune hepatitis (AIH) type 1, approximately 65% of patients with autoimmune cholangitis, approximately 30% of patients with primary biliary cirrhosis, and approximately 2% of healthy people. High values are closely correlated with AIH type 1.   Hemochromatosis DNA-PCR(c282y,h63d)     Status: None   Collection Time: 12/30/16  3:14 PM  Result Value Ref Range   DNA Mutation Analysis REPORT     Comment: DNA Mutation Analysis                              RESULT: NEGATIVE                              INTERPRETATION: DNA testing indicates that this individual is negative for the C282Y and H63D mutations in the HFE gene.  This negative result significantly reduces the likelihood of hereditary hemochromatosis (HH) in this individual.  However, it does not rule out the presence of other mutations within the HFE gene or a diagnosis of HH.  The risk of this individual carrying a HFE mutation other than those tested in this assay depends greatly on  family and clinical history as well as ethnicity.  This assay does not test for other primary or secondary iron overload disorders.                              Gillian Shields, Ph.D., [facility] Director, Molecular Genetics Hereditary hemochromatosis [facility]) is an autosomal recessive disorder of iron metabolism that results in iron overload and potential organ failure.  It is one of the most common genetic disorders in individuals  of European-Caucasian ancestry, with an estimated  carrier frequency of 10%.  HH is caused by mutations in the HFE gene.  Most individuals with HH (60-90%) are homozygous for the C282Y mutation.  A smaller percentage of affected individuals are either compound heterozygous for the C282Y and H63D mutations (3%-8%), or homozygous for the H63D mutation (approximately 1%). This assay detects the two mutations in the HFE gene, C282Y (NM_000410.2: c.845G>A) and H63D ([id number]: c. 187C>G), that are commonly associated with HH.  The mutations are detected by multiplex-polymerase chain reaction (PCR) amplification, followed by digestion of the amplification products with the restriction enzymes Rsal and NlaIII, for the detection of the C282Y and H63D mutations respectively.  Fluorescent-labeled restriction fragments are detected by capillary electrophoresis. This assay does not detect other mutations in the HFE gene that can cause HH.  Since genetic variation and other fac tors can affect the accuracy of direct mutation testing, these results should be interpreted in light of clinical and familial data. For assistance with interpretation of these results, please contact your local [facility] genetic counselor or call 1-866-GENEINFO [phone]). This test was developed and its analytical performance characteristics have been determined by [employer], [place], [place]. It has not been cleared or approved by the FDA. This assay has  been validated pursuant to the CLIA regulations and is used for clinical purposes. For more information on this test, go to http://education.[facility].com/faq/hemochromatos     Assessment/Plan: 1. Encounter to establish care - Follow up for CPE  - Educated on the importance of diet and exercise to lose weight   2. Essential hypertension - Will start on Cozaar 50 mg  - losartan (COZAAR) 50 MG tablet; Take 1 tablet (50 mg total) by mouth daily.  Dispense: 90 tablet; Refill: 1 - Follow up in 2 weeks  3. Alcohol abuse - Educated on the importance of quitting alcohol  - Advised to follow up with [facility] outpatient rehab     Shirline Frees, NP

## 2017-01-22 NOTE — Patient Instructions (Signed)
It was great meeting you today   I am going to start you on a medication called Cozaar to control your blood pressure.   Please look into [facility] for alcohol treatment   Follow up with me in 2 weeks for your physical

## 2017-02-05 ENCOUNTER — Encounter: Payer: BLUE CROSS/BLUE SHIELD | Admitting: Adult Health

## 2017-02-11 ENCOUNTER — Encounter: Payer: BLUE CROSS/BLUE SHIELD | Admitting: Adult Health

## 2017-02-11 NOTE — Progress Notes (Deleted)
   Subjective:    Patient ID: Barry MartinezShaunte L Harris, male    DOB: 02/25/1971, 46 y.o.   MRN: [id number]  HPI   Patient presents for yearly preventative medicine examination. He is a pleasant 46 year old male who  has a past medical history of Alcohol abuse; Elevated liver enzymes; and Hypertension.  All immunizations and health maintenance protocols were reviewed with the patient and needed orders were placed.  Appropriate screening laboratory values were ordered for the patient including screening of hyperlipidemia, renal function and hepatic function. If indicated by BPH, a PSA was ordered.  Medication reconciliation,  past medical history, social history, problem list and allergies were reviewed in detail with the patient  Goals were established with regard to weight loss, exercise, and  diet in compliance with medications  Hypertension - Has been a chronic issue. Has been on multiple medications in the past and stopped taking it due to side effects. He was placed on Cozaar during his initial visit.   Alcohol Abuse - Drinks about a pint of vodka a day. He can go 2-3 days without any alcohol but then goes through withdraw. He was given literature on local alcohol rehab facilities in [place] during his last visit.    Review of Systems  Constitutional: Negative.   HENT: Negative.   Eyes: Negative.   Respiratory: Negative.   Cardiovascular: Negative.   Gastrointestinal: Negative.   Endocrine: Negative.   Genitourinary: Negative.   Musculoskeletal: Negative.   Skin: Negative.   Allergic/Immunologic: Negative.   Neurological: Negative.   Hematological: Negative.   Psychiatric/Behavioral: Negative.   All other systems reviewed and are negative.      Objective:   Physical Exam  Constitutional: He is oriented to person, place, and time. He appears well-developed and well-nourished. No distress.  HENT:  Head: Normocephalic and atraumatic.  Right Ear: External ear normal.    Left Ear: External ear normal.  Nose: Nose normal.  Mouth/Throat: Oropharynx is clear and moist. No oropharyngeal exudate.  Eyes: Conjunctivae are normal. Pupils are equal, round, and reactive to light. Right eye exhibits no discharge. Left eye exhibits no discharge.  Neck: No tracheal deviation present. No thyromegaly present.  Cardiovascular: Normal rate, regular rhythm, normal heart sounds and intact distal pulses.  Exam reveals no gallop and no friction rub.   No murmur heard. Pulmonary/Chest: Effort normal and breath sounds normal. No respiratory distress. He has no wheezes. He has no rales. He exhibits no tenderness.  Abdominal: Soft. Bowel sounds are normal. He exhibits no distension. There is no tenderness. There is no rebound and no guarding.  Musculoskeletal: Normal range of motion.  Lymphadenopathy:    He has no cervical adenopathy.  Neurological: He is alert and oriented to person, place, and time. No cranial nerve deficit. Coordination normal.  Skin: Skin is warm and dry. No rash noted. He is not diaphoretic. No erythema. No pallor.  Psychiatric: He has a normal mood and affect. Judgment and thought content normal.  Nursing note and vitals reviewed.         Assessment & Plan:

## 2017-02-22 ENCOUNTER — Ambulatory Visit: Payer: BLUE CROSS/BLUE SHIELD | Admitting: Internal Medicine

## 2017-03-01 ENCOUNTER — Ambulatory Visit (INDEPENDENT_AMBULATORY_CARE_PROVIDER_SITE_OTHER): Payer: BLUE CROSS/BLUE SHIELD | Admitting: Family Medicine

## 2017-03-01 ENCOUNTER — Encounter: Payer: Self-pay | Admitting: Family Medicine

## 2017-03-01 VITALS — BP 156/94 | HR 107 | Temp 98.4°F | Wt 270.0 lb

## 2017-03-01 DIAGNOSIS — F1010 Alcohol abuse, uncomplicated: Secondary | ICD-10-CM

## 2017-03-01 DIAGNOSIS — I10 Essential (primary) hypertension: Secondary | ICD-10-CM | POA: Diagnosis not present

## 2017-03-01 NOTE — Patient Instructions (Addendum)
Please contact on the agencies that was provided to you today. Also, please take your medication for your blood pressure and follow up with [facility] in 2 weeks.     Hypertension Hypertension is another name for high blood pressure. High blood pressure forces your heart to work harder to pump blood. This can cause problems over time. There are two numbers in a blood pressure reading. There is a top number (systolic) over a bottom number (diastolic). It is best to have a blood pressure below 120/80. Healthy choices can help lower your blood pressure. You may need medicine to help lower your blood pressure if:  Your blood pressure cannot be lowered with healthy choices.  Your blood pressure is higher than 130/80. Follow these instructions at home: Eating and drinking   If directed, follow the DASH eating plan. This diet includes:  Filling half of your plate at each meal with fruits and vegetables.  Filling one quarter of your plate at each meal with whole grains. Whole grains include whole wheat pasta, brown rice, and whole grain bread.  Eating or drinking low-fat dairy products, such as skim milk or low-fat yogurt.  Filling one quarter of your plate at each meal with low-fat (lean) proteins. Low-fat proteins include fish, skinless chicken, eggs, beans, and tofu.  Avoiding fatty meat, cured and processed meat, or chicken with skin.  Avoiding premade or processed food.  Eat less than 1,500 mg of salt (sodium) a day.  Limit alcohol use to no more than 1 drink a day for nonpregnant women and 2 drinks a day for men. One drink equals 12 oz of beer, 5 oz of wine, or 1 oz of hard liquor. Lifestyle   Work with your doctor to stay at a healthy weight or to lose weight. Ask your doctor what the best weight is for you.  Get at least 30 minutes of exercise that causes your heart to beat faster (aerobic exercise) most days of the week. This may include walking, swimming, or biking.  Get at least 30  minutes of exercise that strengthens your muscles (resistance exercise) at least 3 days a week. This may include lifting weights or pilates.  Do not use any products that contain nicotine or tobacco. This includes cigarettes and e-cigarettes. If you need help quitting, ask your doctor.  Check your blood pressure at home as told by your doctor.  Keep all follow-up visits as told by your doctor. This is important. Medicines   Take over-the-counter and prescription medicines only as told by your doctor. Follow directions carefully.  Do not skip doses of blood pressure medicine. The medicine does not work as well if you skip doses. Skipping doses also puts you at risk for problems.  Ask your doctor about side effects or reactions to medicines that you should watch for. Contact a doctor if:  You think you are having a reaction to the medicine you are taking.  You have headaches that keep coming back (recurring).  You feel dizzy.  You have swelling in your ankles.  You have trouble with your vision. Get help right away if:  You get a very bad headache.  You start to feel confused.  You feel weak or numb.  You feel faint.  You get very bad pain in your:  Chest.  Belly (abdomen).  You throw up (vomit) more than once.  You have trouble breathing. Summary  Hypertension is another name for high blood pressure.  Making healthy choices can help  lower blood pressure. If your blood pressure cannot be controlled with healthy choices, you may need to take medicine. This information is not intended to replace advice given to you by your health care provider. Make sure you discuss any questions you have with your health care provider. Document Released: 03/16/2008 Document Revised: 08/26/2016 Document Reviewed: 08/26/2016 Elsevier Interactive Patient Education  2017 Elsevier Inc.   WE NOW OFFER   [facility] FAST TRACK!!!  SAME DAY Appointments for ACUTE CARE  Such  as: Sprains, Injuries, cuts, abrasions, rashes, muscle pain, joint pain, back pain Colds, flu, sore throats, headache, allergies, cough, fever  Ear pain, sinus and eye infections Abdominal pain, nausea, vomiting, diarrhea, upset stomach Animal/insect bites  3 Easy Ways to Schedule: Walk-In Scheduling Call in scheduling Mychart Sign-up: [email or web address]

## 2017-03-01 NOTE — Progress Notes (Signed)
Subjective:    Patient ID: Barry MartinezShaunte L Shrider, male    DOB: 04/07/1971, 46 y.o.   MRN: [id number]  HPI  Mr. Amie CritchleyBlackwell is a 46 year old male who presents today with nausea that has been present for 2 weeks and is associated with attempts of cutting back on drinking vodka.  He states that he does not have nausea when drinking and reports a history of fatty liver. He reports drinking 1 to 1.5 pints of vodka daily. He reports drinking a "fifth" a day on the weekend. He states that this is typical withdrawal symptoms and he thought he could cut back on his own however he is now interested in seeking rehab.  Blood pressure is elevated today. He denies taking his Losartan because he has been drinking and states that he knows he should be taking this medication. He also openly admits that he does not seek routine care and has not followed up as his PCP has requested.  Today, he denies vomiting, anxiety, diaphoresis, chest pain, palpitations, SOB, numbness, tingling, weakness, headaches, or edema. He does not follow a particular diet or exercise program.   Review of Systems  Constitutional: Negative for chills, fatigue and fever.  Eyes: Negative for visual disturbance.  Respiratory: Negative for cough, shortness of breath and wheezing.   Cardiovascular: Negative for chest pain and palpitations.  Gastrointestinal: Positive for nausea. Negative for abdominal pain, diarrhea and vomiting.  Neurological: Negative for dizziness, seizures, weakness, light-headedness, numbness and headaches.  Psychiatric/Behavioral:       Denies depressed or anxious mood   Past Medical History:  Diagnosis Date  . Alcohol abuse   . Elevated liver enzymes   . Hypertension      Social History   Social History  . Marital status: Single    Spouse name: N/A  . Number of children: N/A  . Years of education: N/A   Occupational History  . [employer]   Social History Main Topics  . Smoking  status: Former Smoker    Quit date: 04/20/2001  . Smokeless tobacco: Never Used  . Alcohol use 1.5 oz/week    3 Standard drinks or equivalent per week     Comment: former drinker/ drinking problem /  . Drug use: No  . Sexual activity: Not Currently   Other Topics Concern  . Not on file   Social History Narrative   Works at [employer] as Engineer, [employer] for air[employer].    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       Exercise: Lifts weights some. Rides stationary bike 2-3 times per week -but does this in spurts.                    Does not consistently exercise and stay on routine.      Diet: Fairly careful with what he eats.    Past Surgical History:  Procedure Laterality Date  . SPINE SURGERY     46yo    Family History  Problem Relation Age of Onset  . Cancer Father        prostate  . Asthma Sister   . Diabetes Sister   . Diabetes Maternal Grandmother   . Heart disease Maternal Grandmother   . Diabetes Mother   . Hypertension Mother   . Breast cancer Sister     No Known Allergies  Current Outpatient Prescriptions on File Prior to Visit  Medication Sig Dispense Refill  . losartan (COZAAR) 50 MG  tablet Take 1 tablet (50 mg total) by mouth daily. 90 tablet 1  . Multiple Vitamin (MULTIVITAMIN) tablet Take 1 tablet by mouth daily.    . S-Adenosylmethionine (SAM-E) 200 MG TABS Take by mouth daily.     No current facility-administered medications on file prior to visit.     BP (!) 156/94   Pulse (!) 107   Temp 98.4 F (36.9 C) (Oral)   Wt 270 lb (122.5 kg)   SpO2 98%   BMI 38.19 kg/m     Objective:   Physical Exam  Constitutional: He is oriented to person, place, and time. He appears well-developed and well-nourished.  Eyes: Pupils are equal, round, and reactive to light. No scleral icterus.  Neck: Neck supple.  Cardiovascular: Normal rate, regular rhythm and intact distal pulses.   Pulmonary/Chest: Effort normal and breath sounds normal. He has no wheezes. He has no rales.   Abdominal: Soft. Bowel sounds are normal. There is no tenderness.  Musculoskeletal: He exhibits no edema.  Lymphadenopathy:    He has no cervical adenopathy.  Neurological: He is alert and oriented to person, place, and time.  Skin: Skin is warm and dry. No rash noted.  Psychiatric: He has a normal mood and affect. His behavior is normal. Judgment and thought content normal.      Assessment & Plan:  1. Alcohol abuse We discussed the importance of quitting alcohol. He is interested at this time; provided options for outpatient and residential treatment for patient. Written handout provided with contact information for both outpatient and residential treatment.  2. Essential hypertension Not controlled due to non adherence to medication. He reports that he will take this medication today and he will follow up with his PCP in 2 weeks. Advised monitoring of blood pressure and bringing his readings with him to his next appointment.  Advised patient to follow up with his PCP for routine care and management of BP. He will seek care today for alcohol treatment programs and agreed to follow up with [facility] after he has established a plan of care for quitting alcohol. We discussed the importance of monitoring blood pressure and taking medications as prescribed by his PCP.  He voiced understanding and agreed with plan.  Roddie Mc, FNP-C

## 2017-03-02 ENCOUNTER — Telehealth: Payer: Self-pay | Admitting: Adult Health

## 2017-03-02 NOTE — Telephone Encounter (Signed)
Pt  Was seen yesterday and would like work note for wed, thur and Friday. Pt is aware julia not in office until thursday

## 2017-03-02 NOTE — Telephone Encounter (Signed)
Patient decided to try and detox at home. Patient advised that neither provider is in office until Thursday. Will route note to both for recommendations.  Rosita FireJulia / Cory - Please advise. Thanks!

## 2017-03-03 ENCOUNTER — Encounter (HOSPITAL_COMMUNITY): Payer: Self-pay

## 2017-03-03 ENCOUNTER — Emergency Department (HOSPITAL_COMMUNITY)
Admission: EM | Admit: 2017-03-03 | Discharge: 2017-03-03 | Disposition: A | Discharge status: Home / Self Care | Payer: BLUE CROSS/BLUE SHIELD | Attending: Emergency Medicine | Admitting: Emergency Medicine

## 2017-03-03 DIAGNOSIS — F10930 Alcohol use, unspecified with withdrawal, uncomplicated: Secondary | ICD-10-CM

## 2017-03-03 DIAGNOSIS — Z87891 Personal history of nicotine dependence: Secondary | ICD-10-CM | POA: Insufficient documentation

## 2017-03-03 DIAGNOSIS — F10239 Alcohol dependence with withdrawal, unspecified: Secondary | ICD-10-CM | POA: Diagnosis not present

## 2017-03-03 DIAGNOSIS — I10 Essential (primary) hypertension: Secondary | ICD-10-CM | POA: Diagnosis not present

## 2017-03-03 DIAGNOSIS — Z79899 Other long term (current) drug therapy: Secondary | ICD-10-CM | POA: Diagnosis not present

## 2017-03-03 DIAGNOSIS — F10230 Alcohol dependence with withdrawal, uncomplicated: Secondary | ICD-10-CM

## 2017-03-03 DIAGNOSIS — R002 Palpitations: Secondary | ICD-10-CM | POA: Diagnosis present

## 2017-03-03 LAB — MAGNESIUM: Magnesium: 2.1 mg/dL (ref 1.7–2.4)

## 2017-03-03 LAB — COMPREHENSIVE METABOLIC PANEL
ALBUMIN: 3.2 g/dL — AB (ref 3.5–5.0)
ALT: 225 U/L — ABNORMAL HIGH (ref 17–63)
ANION GAP: 8 (ref 5–15)
AST: 471 U/L — AB (ref 15–41)
Alkaline Phosphatase: 112 U/L (ref 38–126)
BUN: 17 mg/dL (ref 6–20)
CHLORIDE: 102 mmol/L (ref 101–111)
CO2: 22 mmol/L (ref 22–32)
Calcium: 8.4 mg/dL — ABNORMAL LOW (ref 8.9–10.3)
Creatinine, Ser: 0.82 mg/dL (ref 0.61–1.24)
GFR calc Af Amer: >60 mL/min (ref >60)
GFR calc non Af Amer: >60 mL/min (ref >60)
Glucose, Bld: 154 mg/dL — ABNORMAL HIGH (ref 65–99)
POTASSIUM: 4.7 mmol/L (ref 3.5–5.1)
SODIUM: 132 mmol/L — AB (ref 135–145)
Total Bilirubin: 2 mg/dL — ABNORMAL HIGH (ref 0.3–1.2)
Total Protein: 6.7 g/dL (ref 6.5–8.1)

## 2017-03-03 LAB — CBC WITH DIFFERENTIAL/PLATELET
BASOS PCT: 2 %
Basophils Absolute: 0.1 10*3/uL (ref 0.0–0.1)
EOS ABS: 0 10*3/uL (ref 0.0–0.7)
Eosinophils Relative: 1 %
HEMATOCRIT: 39.3 % (ref 39.0–52.0)
Hemoglobin: 14.2 g/dL (ref 13.0–17.0)
LYMPHS PCT: 31 %
Lymphs Abs: 1.3 10*3/uL (ref 0.7–4.0)
MCH: 31.3 pg (ref 26.0–34.0)
MCHC: 36.1 g/dL — ABNORMAL HIGH (ref 30.0–36.0)
MCV: 86.6 fL (ref 78.0–100.0)
Monocytes Absolute: 0.5 10*3/uL (ref 0.1–1.0)
Monocytes Relative: 11 %
NEUTROS ABS: 2.5 10*3/uL (ref 1.7–7.7)
NEUTROS PCT: 55 %
PLATELETS: 138 10*3/uL — AB (ref 150–400)
RBC: 4.54 MIL/uL (ref 4.22–5.81)
RDW: 14.3 % (ref 11.5–15.5)
WBC: 4.4 10*3/uL (ref 4.0–10.5)

## 2017-03-03 LAB — ETHANOL: Alcohol, Ethyl (B): 112 mg/dL — ABNORMAL HIGH (ref <5)

## 2017-03-03 MED ORDER — VITAMIN B-1 100 MG PO TABS
100.0000 mg | ORAL_TABLET | Freq: Once | ORAL | Status: AC
  Administered 2017-03-03: 100 mg via ORAL
  Filled 2017-03-03: qty 1

## 2017-03-03 MED ORDER — CHLORDIAZEPOXIDE HCL 25 MG PO CAPS: ORAL_CAPSULE | ORAL | 0 refills | Status: DC

## 2017-03-03 MED ORDER — SODIUM CHLORIDE 0.9 % IV BOLUS (SEPSIS)
1000.0000 mL | Freq: Once | INTRAVENOUS | Status: AC
  Administered 2017-03-03: 1000 mL via INTRAVENOUS

## 2017-03-03 MED ORDER — CHLORDIAZEPOXIDE HCL 25 MG PO CAPS
75.0000 mg | ORAL_CAPSULE | Freq: Once | ORAL | Status: AC
  Administered 2017-03-03: 75 mg via ORAL
  Filled 2017-03-03: qty 3

## 2017-03-03 MED ORDER — FOLIC ACID 1 MG PO TABS
1.0000 mg | ORAL_TABLET | Freq: Once | ORAL | Status: AC
  Administered 2017-03-03: 1 mg via ORAL
  Filled 2017-03-03: qty 1

## 2017-03-03 MED ORDER — LORAZEPAM 2 MG/ML IJ SOLN
2.0000 mg | Freq: Once | INTRAMUSCULAR | Status: AC
  Administered 2017-03-03: 2 mg via INTRAVENOUS
  Filled 2017-03-03: qty 1

## 2017-03-03 NOTE — ED Triage Notes (Signed)
Pt reports feeling chest palpatations, nausea, headache and increased anxiety. Pt reports stopping drinking alcohol x2 days ago to detox himself. Pt reports being a daily alcohol consumer. Pt denies hx of DTs. Pts CIWA 11. Pt A+OX4, speaking in complete sentences, ambulatory w/ normal gait.

## 2017-03-03 NOTE — ED Provider Notes (Signed)
[facility]-EMERGENCY DEPT Provider Note   CSN: [id number] Arrival date & time: 03/03/17  0636     History   Chief Complaint Chief Complaint  Patient presents with  . Palpitations    HPI Barry Harris is a 46 y.o. male.  The history is provided by the patient.  Palpitations   This is a new problem. The current episode started 2 days ago. The problem occurs constantly. The problem has not changed since onset.The problem is associated with anxiety. Associated symptoms include malaise/fatigue and nausea. Pertinent negatives include no diaphoresis, no fever, no chest pain, no chest pressure, no near-syncope, no syncope, no abdominal pain, no vomiting, no lower extremity edema, no cough and no shortness of breath. He has tried nothing for the symptoms. The treatment provided no relief.   46 year old male who presents with tremors, palpitations, and anxiety. His history of hypertension and alcohol dependence. States that he tried to self detox 2 days ago, and since then has been having palpitations, anxiety, tremors, and generalized malaise. Has been nauseous with decreased by mouth intake. No vomiting, diarrhea, abdominal pain, chest pain or difficulty breathing. No suicidal or homicidal thoughts. No confusion, auditory or visual hallucinations. No history of seizures or DTs in the past. States that he does drink daily, a fifth to a pint per day of hard liquor. Stopped approximately 2 days ago.   Past Medical History:  Diagnosis Date  . Alcohol abuse   . Elevated liver enzymes   . Hypertension     Patient Active Problem List   Diagnosis Date Noted  . Hypertriglyceridemia 12/04/2014  . Palpitations 11/30/2014  . Elevated transaminase level 11/30/2014  . Alcohol abuse 11/30/2014  . Hyponatremia 11/30/2014  . Dehydration   . Transaminitis   . GAD (generalized anxiety disorder) 06/12/2014  . Obesity 05/08/2014  . Hypertension     Past Surgical History:  Procedure Laterality Date    . SPINE SURGERY     46yo       Home Medications    Prior to Admission medications   Medication Sig Start Date End Date Taking? Authorizing Provider  losartan (COZAAR) 50 MG tablet Take 1 tablet (50 mg total) by mouth daily. 01/22/17  Yes Nafziger, Kandee Keen, NP  chlordiazePOXIDE (LIBRIUM) 25 MG capsule 50mg  PO TID x 1D, then 25-50mg  PO BID X 1D, then 25-50mg  PO QD X 1D 03/03/17   Lavera Guise, MD    Family History Family History  Problem Relation Age of Onset  . Cancer Father        prostate  . Asthma Sister   . Diabetes Sister   . Diabetes Maternal Grandmother   . Heart disease Maternal Grandmother   . Diabetes Mother   . Hypertension Mother   . Breast cancer Sister     Social History Social History  Substance Use Topics  . Smoking status: Former Smoker    Quit date: 04/20/2001  . Smokeless tobacco: Never Used  . Alcohol use 1.5 oz/week    3 Standard drinks or equivalent per week     Comment: former drinker/ drinking problem /     Allergies   Patient has no known allergies.   Review of Systems Review of Systems  Constitutional: Positive for malaise/fatigue. Negative for diaphoresis and fever.  Respiratory: Negative for cough and shortness of breath.   Cardiovascular: Positive for palpitations. Negative for chest pain, syncope and near-syncope.  Gastrointestinal: Positive for nausea. Negative for abdominal pain and vomiting.  Allergic/Immunologic: Negative for  immunocompromised state.  Neurological: Negative for syncope.  Hematological: Does not bruise/bleed easily.  Psychiatric/Behavioral: Negative for confusion.  All other systems reviewed and are negative.    Physical Exam Updated Vital Signs BP (!) 152/99   Pulse (!) 113   Temp 97.8 F (36.6 C) (Oral)   Physical Exam Physical Exam  Nursing note and vitals reviewed. Constitutional:  non-toxic, mildly anxious and tremulous Head: Normocephalic and atraumatic.  Mouth/Throat: Oropharynx is clear and  moist.  Neck: Normal range of motion. Neck supple.  Cardiovascular: Tachycardic rate and regular rhythm.   Pulmonary/Chest: Effort normal and breath sounds normal.  Abdominal: Soft. There is no tenderness. There is no rebound and no guarding.  Musculoskeletal: Normal range of motion.  Neurological: Alert, no facial droop, fluent speech, moves all extremities symmetrically Skin: Skin is warm and dry.  Psychiatric: Cooperative   ED Treatments / Results  Labs (all labs ordered are listed, but only abnormal results are displayed) Labs Reviewed  CBC WITH DIFFERENTIAL/PLATELET - Abnormal; Notable for the following:       Result Value   MCHC 36.1 (*)    Platelets 138 (*)    All other components within normal limits  ETHANOL - Abnormal; Notable for the following:    Alcohol, Ethyl (B) 112 (*)    All other components within normal limits  COMPREHENSIVE METABOLIC PANEL - Abnormal; Notable for the following:    Sodium 132 (*)    Glucose, Bld 154 (*)    Calcium 8.4 (*)    Albumin 3.2 (*)    AST 471 (*)    ALT 225 (*)    Total Bilirubin 2.0 (*)    All other components within normal limits  MAGNESIUM    EKG  EKG Interpretation  Date/Time:  Wednesday Mar 03 2017 07:00:57 EDT Ventricular Rate:  111 PR Interval:    QRS Duration: 92 QT Interval:  326 QTC Calculation: 443 R Axis:   47 Text Interpretation:  Sinus tachycardia similar to last EKG  Confirmed by Azir Muzyka MD, Kelli Egolf ([id number]) on 03/03/2017 7:03:05 AM       Radiology No results found.  Procedures Procedures (including critical care time)  Medications Ordered in ED Medications  LORazepam (ATIVAN) injection 2 mg (2 mg Intravenous Given 03/03/17 0759)  sodium chloride 0.9 % bolus 1,000 mL (0 mLs Intravenous Stopped 03/03/17 0914)  thiamine (VITAMIN B-1) tablet 100 mg (100 mg Oral Given 03/03/17 0734)  folic acid (FOLVITE) tablet 1 mg (1 mg Oral Given 03/03/17 0734)  chlordiazePOXIDE (LIBRIUM) capsule 75 mg (75 mg Oral Given  03/03/17 0935)  sodium chloride 0.9 % bolus 1,000 mL (1,000 mLs Intravenous New Bag/Given 03/03/17 0936)     Initial Impression / Assessment and Plan / ED Course  I have reviewed the triage vital signs and the nursing notes.  Pertinent labs & imaging results that were available during my care of the patient were reviewed by me and considered in my medical decision making (see chart for details).     Presenting symptoms concerning for alcohol withdrawal. His initial CIWA score is 11. No delirium or changes in mental status. States his symptoms resolved with 2 mg of ativan and he feels significantly better. Still mild tachycardia and mild hypertension, but refused further ativan and states he would like to manage symptoms from home. Given dose of librium in ED, IVFs, thiamine, folate. Given improved symptoms, will discharge with course of librium. Substance abuse resources provided. Discussed strict return instructions. He expressed understanding of  all discharge instructions and felt comfortable with the plan of care.   Final Clinical Impressions(s) / ED Diagnoses   Final diagnoses:  Alcohol withdrawal syndrome without complication (HCC)    New Prescriptions New Prescriptions   CHLORDIAZEPOXIDE (LIBRIUM) 25 MG CAPSULE    50mg  PO TID x 1D, then 25-50mg  PO BID X 1D, then 25-50mg  PO QD X 1D     Lavera Guise, MD 03/03/17 1005

## 2017-03-03 NOTE — Discharge Instructions (Signed)
You have been given some resources for substance abuse. Please take medication as prescribed for your symptoms of withdrawal.  Please return without fail for worsening symptoms, including confusion, intractable vomiting, worsening tremors and anxiety, or any other symptoms concerning to you.

## 2017-03-03 NOTE — Telephone Encounter (Signed)
Please call patient and ask if he has sought care through an outpatient rehab center. During his visit, he indicated that he was interested in inpatient rehab and it is important that he has a medical plan of care for detox. It is unclear with this note if he has initiated rehab in an outpatient setting.  A work note can be provided for the day of visit only and advise him to seek care through one of the options provided to him.

## 2017-03-04 NOTE — Telephone Encounter (Signed)
Called patient left a voice message to return my call back in the office.

## 2017-03-16 ENCOUNTER — Ambulatory Visit (INDEPENDENT_AMBULATORY_CARE_PROVIDER_SITE_OTHER): Payer: BLUE CROSS/BLUE SHIELD | Admitting: Adult Health

## 2017-03-16 ENCOUNTER — Encounter: Payer: Self-pay | Admitting: Adult Health

## 2017-03-16 VITALS — BP 144/82 | Temp 98.6°F | Ht 70.5 in | Wt 279.7 lb

## 2017-03-16 DIAGNOSIS — F10930 Alcohol use, unspecified with withdrawal, uncomplicated: Secondary | ICD-10-CM

## 2017-03-16 DIAGNOSIS — I10 Essential (primary) hypertension: Secondary | ICD-10-CM | POA: Diagnosis not present

## 2017-03-16 DIAGNOSIS — F10230 Alcohol dependence with withdrawal, uncomplicated: Secondary | ICD-10-CM

## 2017-03-16 LAB — HEPATIC FUNCTION PANEL
ALT: 67 U/L — ABNORMAL HIGH (ref 0–53)
AST: 96 U/L — ABNORMAL HIGH (ref 0–37)
Albumin: 3.5 g/dL (ref 3.5–5.2)
Alkaline Phosphatase: 111 U/L (ref 39–117)
BILIRUBIN DIRECT: 0.3 mg/dL (ref 0.0–0.3)
BILIRUBIN TOTAL: 1 mg/dL (ref 0.2–1.2)
Total Protein: 7.3 g/dL (ref 6.0–8.3)

## 2017-03-16 MED ORDER — LORAZEPAM 0.5 MG PO TABS: 0.5000 mg | ORAL_TABLET | Freq: Three times a day (TID) | ORAL | 0 refills | Status: DC | PRN

## 2017-03-16 MED ORDER — LOSARTAN POTASSIUM 100 MG PO TABS: 100.0000 mg | ORAL_TABLET | Freq: Every day | ORAL | 3 refills | Status: DC

## 2017-03-16 NOTE — Progress Notes (Addendum)
Subjective:    Patient ID: Barry MartinezShaunte L Harris, male    DOB: 01/27/1971, 46 y.o.   MRN: [id number]  HPI  46 year old male who  has a past medical history of Alcohol abuse; Elevated liver enzymes; and Hypertension. He presents to the office today for two week follow up regarding hypertension. He last saw Barry HarnessJulie Harris two weeks ago for high blood pressure due to non adherence.   During this visit he was also asking for treatment options due to alcohol abuse. He was seen in the ER two days later for alcohol withdrawal and was given a prescription for Librium. He reports that he finished this course. He has cut back on drinking from a pint to a shot or two every other day. He still finds that he is going through withdraw symptoms from time to time   His liver enzymes were elevated in the hospital   Last ALT and AST was 225 and 471  Since being discharged from the hospital he has been taking his blood pressure medication on a daily basis.   BP Readings from Last 3 Encounters:  03/16/17 (!) 144/82  03/03/17 (!) 152/107  03/01/17 (!) 156/94     Review of Systems  Constitutional: Negative.   Respiratory: Negative.   Cardiovascular: Negative.   Gastrointestinal: Negative.   Musculoskeletal: Negative.   Psychiatric/Behavioral: Positive for agitation and sleep disturbance. Negative for suicidal ideas. The patient is not nervous/anxious.   All other systems reviewed and are negative.  Past Medical History:  Diagnosis Date  . Alcohol abuse   . Elevated liver enzymes   . Hypertension     Social History   Social History  . Marital status: Single    Spouse name: N/A  . Number of children: N/A  . Years of education: N/A   Occupational History  . [employer]   Social History Main Topics  . Smoking status: Former Smoker    Quit date: 04/20/2001  . Smokeless tobacco: Never Used  . Alcohol use 1.5 oz/week    3 Standard drinks or equivalent per week   Comment: former drinker/ drinking problem /  . Drug use: No  . Sexual activity: Not Currently   Other Topics Concern  . 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       Exercise: Lifts weights some. Rides stationary bike 2-3 times per week -but does this in spurts.                    Does not consistently exercise and stay on routine.      Diet: Fairly careful with what he eats.    Past Surgical History:  Procedure Laterality Date  . SPINE SURGERY     46yo    Family History  Problem Relation Age of Onset  . Cancer Father        prostate  . Asthma Sister   . Diabetes Sister   . Diabetes Maternal Grandmother   . Heart disease Maternal Grandmother   . Diabetes Mother   . Hypertension Mother   . Breast cancer Sister     No Known Allergies  Current Outpatient Prescriptions on File Prior to Visit  Medication Sig Dispense Refill  . chlordiazePOXIDE (LIBRIUM) 25 MG capsule 50mg  PO TID x 1D, then 25-50mg  PO BID X 1D, then 25-50mg  PO QD  X 1D 10 capsule 0   No current facility-administered medications on file prior to visit.     BP (!) 144/82 (BP Location: Left Arm, Patient Position: Sitting, Cuff Size: Large)   Temp 98.6 F (37 C) (Oral)   Ht 5' 10.5" (1.791 m)   Wt 279 lb 11.2 oz (126.9 kg)   BMI 39.57 kg/m       Objective:   Physical Exam  Constitutional: He is oriented to person, place, and time. He appears well-developed and well-nourished. No distress.  Cardiovascular: Normal rate, regular rhythm, normal heart sounds and intact distal pulses.  Exam reveals no gallop and no friction rub.   No murmur heard. Pulmonary/Chest: Effort normal and breath sounds normal. No respiratory distress. He has no wheezes. He has no rales. He exhibits no tenderness.  Neurological: He is alert and oriented to person, place, and time.  Skin: Skin is warm and dry. No rash noted. He is not diaphoretic. No  erythema. No pallor.  Psychiatric: He has a normal mood and affect. His behavior is normal. Judgment and thought content normal.  Nursing note and vitals reviewed.     Assessment & Plan:  1. Essential hypertension - Increase Cozzar from 50 mg to 100mg   - losartan (COZAAR) 100 MG tablet; Take 1 tablet (100 mg total) by mouth daily.  Dispense: 30 tablet; Refill: 3 - Follow up in one month   2. Alcohol withdrawal syndrome without complication (HCC) - Will give ativan to take as needed for withdraw symptoms - Hepatic function panel - LORazepam (ATIVAN) 0.5 MG tablet; Take 1 tablet (0.5 mg total) by mouth every 8 (eight) hours as needed for anxiety.  Dispense: 60 tablet; Refill: 0 - Strict return precautions given  - Follow up in one month    Shirline Frees, NP

## 2017-03-16 NOTE — Telephone Encounter (Signed)
Patient had an appointment with Kandee Keenory this morning

## 2017-04-01 ENCOUNTER — Encounter (HOSPITAL_COMMUNITY): Payer: Self-pay | Admitting: *Deleted

## 2017-04-01 ENCOUNTER — Emergency Department (HOSPITAL_COMMUNITY)
Admission: EM | Admit: 2017-04-01 | Discharge: 2017-04-01 | Disposition: A | Discharge status: Home / Self Care | Payer: BLUE CROSS/BLUE SHIELD | Attending: Emergency Medicine | Admitting: Emergency Medicine

## 2017-04-01 DIAGNOSIS — Z79899 Other long term (current) drug therapy: Secondary | ICD-10-CM | POA: Diagnosis not present

## 2017-04-01 DIAGNOSIS — I10 Essential (primary) hypertension: Secondary | ICD-10-CM | POA: Insufficient documentation

## 2017-04-01 DIAGNOSIS — Z87891 Personal history of nicotine dependence: Secondary | ICD-10-CM | POA: Diagnosis not present

## 2017-04-01 DIAGNOSIS — R002 Palpitations: Secondary | ICD-10-CM | POA: Diagnosis present

## 2017-04-01 DIAGNOSIS — F10230 Alcohol dependence with withdrawal, uncomplicated: Secondary | ICD-10-CM | POA: Diagnosis not present

## 2017-04-01 DIAGNOSIS — D696 Thrombocytopenia, unspecified: Secondary | ICD-10-CM | POA: Diagnosis not present

## 2017-04-01 DIAGNOSIS — R748 Abnormal levels of other serum enzymes: Secondary | ICD-10-CM | POA: Diagnosis not present

## 2017-04-01 DIAGNOSIS — R17 Unspecified jaundice: Secondary | ICD-10-CM | POA: Insufficient documentation

## 2017-04-01 DIAGNOSIS — F10930 Alcohol use, unspecified with withdrawal, uncomplicated: Secondary | ICD-10-CM

## 2017-04-01 LAB — CBC WITH DIFFERENTIAL/PLATELET
BASOS ABS: 0.1 10*3/uL (ref 0.0–0.1)
BASOS PCT: 1 %
EOS ABS: 0 10*3/uL (ref 0.0–0.7)
Eosinophils Relative: 0 %
HCT: 36.6 % — ABNORMAL LOW (ref 39.0–52.0)
HEMOGLOBIN: 13.7 g/dL (ref 13.0–17.0)
Lymphocytes Relative: 20 %
Lymphs Abs: 1.4 10*3/uL (ref 0.7–4.0)
MCH: 32.7 pg (ref 26.0–34.0)
MCHC: 37.4 g/dL — AB (ref 30.0–36.0)
MCV: 87.4 fL (ref 78.0–100.0)
MONO ABS: 0.6 10*3/uL (ref 0.1–1.0)
Monocytes Relative: 8 %
NEUTROS ABS: 5.3 10*3/uL (ref 1.7–7.7)
NEUTROS PCT: 72 %
PLATELETS: 106 10*3/uL — AB (ref 150–400)
RBC: 4.19 MIL/uL — ABNORMAL LOW (ref 4.22–5.81)
RDW: 14.7 % (ref 11.5–15.5)
WBC: 7.4 10*3/uL (ref 4.0–10.5)

## 2017-04-01 LAB — RAPID URINE DRUG SCREEN, HOSP PERFORMED
Amphetamines: NOT DETECTED
BARBITURATES: NOT DETECTED
BENZODIAZEPINES: POSITIVE — AB
Cocaine: NOT DETECTED
Opiates: NOT DETECTED
Tetrahydrocannabinol: NOT DETECTED

## 2017-04-01 LAB — COMPREHENSIVE METABOLIC PANEL
ALK PHOS: 161 U/L — AB (ref 38–126)
ALT: 298 U/L — ABNORMAL HIGH (ref 17–63)
ANION GAP: 11 (ref 5–15)
AST: 667 U/L — ABNORMAL HIGH (ref 15–41)
Albumin: 3.3 g/dL — ABNORMAL LOW (ref 3.5–5.0)
BILIRUBIN TOTAL: 3.6 mg/dL — AB (ref 0.3–1.2)
BUN: 10 mg/dL (ref 6–20)
CALCIUM: 8.9 mg/dL (ref 8.9–10.3)
CO2: 23 mmol/L (ref 22–32)
Chloride: 97 mmol/L — ABNORMAL LOW (ref 101–111)
Creatinine, Ser: 0.69 mg/dL (ref 0.61–1.24)
GFR calc Af Amer: >60 mL/min (ref >60)
Glucose, Bld: 126 mg/dL — ABNORMAL HIGH (ref 65–99)
POTASSIUM: 5.3 mmol/L — AB (ref 3.5–5.1)
Sodium: 131 mmol/L — ABNORMAL LOW (ref 135–145)
TOTAL PROTEIN: 7.1 g/dL (ref 6.5–8.1)

## 2017-04-01 LAB — I-STAT TROPONIN, ED: TROPONIN I, POC: 0.01 ng/mL (ref 0.00–0.08)

## 2017-04-01 LAB — ETHANOL: ALCOHOL ETHYL (B): 37 mg/dL — AB (ref <5)

## 2017-04-01 MED ORDER — THIAMINE HCL 100 MG/ML IJ SOLN: 100.0000 mg | Freq: Every day | INTRAMUSCULAR | Status: DC

## 2017-04-01 MED ORDER — LORAZEPAM 2 MG/ML IJ SOLN: 0.0000 mg | Freq: Two times a day (BID) | INTRAMUSCULAR | Status: DC

## 2017-04-01 MED ORDER — VITAMIN B-1 100 MG PO TABS: 100.0000 mg | ORAL_TABLET | Freq: Every day | ORAL | Status: DC

## 2017-04-01 MED ORDER — LORAZEPAM 1 MG PO TABS: 0.0000 mg | ORAL_TABLET | Freq: Two times a day (BID) | ORAL | Status: DC

## 2017-04-01 MED ORDER — SODIUM CHLORIDE 0.9 % IV BOLUS (SEPSIS)
1000.0000 mL | Freq: Once | INTRAVENOUS | Status: AC
  Administered 2017-04-01: 1000 mL via INTRAVENOUS

## 2017-04-01 MED ORDER — LORAZEPAM 2 MG/ML IJ SOLN
0.0000 mg | Freq: Four times a day (QID) | INTRAMUSCULAR | Status: DC
  Administered 2017-04-01: 1 mg via INTRAVENOUS
  Filled 2017-04-01: qty 1

## 2017-04-01 MED ORDER — CHLORDIAZEPOXIDE HCL 25 MG PO CAPS: 25.0000 mg | ORAL_CAPSULE | Freq: Four times a day (QID) | ORAL | 0 refills | Status: DC | PRN

## 2017-04-01 MED ORDER — LORAZEPAM 1 MG PO TABS: 0.0000 mg | ORAL_TABLET | Freq: Four times a day (QID) | ORAL | Status: DC

## 2017-04-01 NOTE — ED Provider Notes (Signed)
[facility]-EMERGENCY DEPT Provider Note   CSN: [id number] Arrival date & time: 04/01/17  0101   By signing my name below, I, Clarisse GougeXavier Herndon, attest that this documentation has been prepared under the direction and in the presence of Dione BoozeGlick, Raed Schalk, MD. Electronically signed, Clarisse GougeXavier Herndon, ED Scribe. 04/01/17. 2:21 AM.   History   Chief Complaint Chief Complaint  Patient presents with  . Shaking   The history is provided by the patient and medical records. No language interpreter was used.    Barry Harris is a 46 y.o. male with h/o HTN, alcohol abuse and elevated liver enzyme levels presenting to the Emergency Department concerning gradually worsening heart palpitations that became severe PTA. He reports difficulty sleeping last night/ this morning. Associated diaphoresis, dry mouth and mild tremor sensation noted. Pt endorses hallucination; he states he sees a "blotch" or "cloud" occasionally. Pt states he decreased alcohol intake from a 1 pint - 1 bottle at a time to ~3 shots at a time starting ~1 week ago. He adds he last drank 1-2 days ago. No tobacco or illicit drug use noted. No chest pain, N/V/D, cough, SOB or chest tightness. No other complaints at this time.   Past Medical History:  Diagnosis Date  . Alcohol abuse   . Elevated liver enzymes   . Hypertension     Patient Active Problem List   Diagnosis Date Noted  . Hypertriglyceridemia 12/04/2014  . Palpitations 11/30/2014  . Elevated transaminase level 11/30/2014  . Alcohol abuse 11/30/2014  . Hyponatremia 11/30/2014  . Dehydration   . Transaminitis   . GAD (generalized anxiety disorder) 06/12/2014  . Obesity 05/08/2014  . Hypertension     Past Surgical History:  Procedure Laterality Date  . SPINE SURGERY     46yo       Home Medications    Prior to Admission medications   Medication Sig Start Date End Date Taking? Authorizing Provider  LORazepam (ATIVAN) 0.5 MG tablet Take 1 tablet (0.5 mg total) by mouth  every 8 (eight) hours as needed for anxiety. 03/16/17  Yes Nafziger, Kandee Keenory, NP  losartan (COZAAR) 100 MG tablet Take 1 tablet (100 mg total) by mouth daily. 03/16/17  Yes Nafziger, Kandee Keenory, NP  Multiple Vitamin (MULTIVITAMIN WITH MINERALS) TABS tablet Take 1 tablet by mouth daily.   Yes [provider]  chlordiazePOXIDE (LIBRIUM) 25 MG capsule 50mg  PO TID x 1D, then 25-50mg  PO BID X 1D, then 25-50mg  PO QD X 1D Patient not taking: Reported on 04/01/2017 03/03/17   Lavera GuiseLiu, Dana Duo, MD    Family History Family History  Problem Relation Age of Onset  . Cancer Father        prostate  . Asthma Sister   . Diabetes Sister   . Diabetes Maternal Grandmother   . Heart disease Maternal Grandmother   . Diabetes Mother   . Hypertension Mother   . Breast cancer Sister     Social History Social History  Substance Use Topics  . Smoking status: Former Smoker    Quit date: 04/20/2001  . Smokeless tobacco: Never Used  . Alcohol use 1.5 oz/week    3 Standard drinks or equivalent per week     Comment: former drinker/ drinking problem /     Allergies   Patient has no known allergies.   Review of Systems Review of Systems  Constitutional: Positive for diaphoresis. Negative for chills and fever.  Respiratory: Negative for chest tightness and shortness of breath.   Cardiovascular: Positive  for palpitations. Negative for chest pain and leg swelling.  Gastrointestinal: Negative for diarrhea, nausea and vomiting.  Neurological: Positive for tremors. Negative for weakness and numbness.  Psychiatric/Behavioral: Positive for hallucinations. The patient is nervous/anxious.   All other systems reviewed and are negative.    Physical Exam Updated Vital Signs BP (!) 188/84 (BP Location: Right Arm)   Pulse (!) 123   Temp 98 F (36.7 C) (Oral)   Resp (!) 9   Ht 5\' 11"  (1.803 m)   Wt 275 lb (124.7 kg)   SpO2 94%   BMI 38.35 kg/m   Physical Exam  Constitutional: He is oriented to person, place, and  time. He appears well-developed and well-nourished.  HENT:  Head: Normocephalic and atraumatic.  Eyes: EOM are normal. Pupils are equal, round, and reactive to light.  Neck: Normal range of motion. Neck supple. No JVD present.  Cardiovascular: Regular rhythm and normal heart sounds.  Tachycardia present.   No murmur heard. Pulmonary/Chest: Effort normal and breath sounds normal. He has no wheezes. He has no rales. He exhibits no tenderness.  Abdominal: Soft. Bowel sounds are normal. He exhibits no distension and no mass. There is no tenderness.  Musculoskeletal: Normal range of motion. He exhibits no edema.  Lymphadenopathy:    He has no cervical adenopathy.  Neurological: He is alert and oriented to person, place, and time.  Mild tremor  Skin: Skin is warm and dry. No rash noted.  Psychiatric: He has a normal mood and affect. His behavior is normal. Judgment and thought content normal.  Nursing note and vitals reviewed.    ED Treatments / Results  DIAGNOSTIC STUDIES: Oxygen Saturation is 94% on RA, NL by my interpretation.    COORDINATION OF CARE: 2:20 AM-Discussed next steps with pt. Pt verbalized understanding and is agreeable with the plan. Will start alcohol withdrawal protocol. Will order fluids, blood work and labs.   Labs (all labs ordered are listed, but only abnormal results are displayed) Labs Reviewed  COMPREHENSIVE METABOLIC PANEL - Abnormal; Notable for the following:       Result Value   Sodium 131 (*)    Potassium 5.3 (*)    Chloride 97 (*)    Glucose, Bld 126 (*)    Albumin 3.3 (*)    AST 667 (*)    ALT 298 (*)    Alkaline Phosphatase 161 (*)    Total Bilirubin 3.6 (*)    All other components within normal limits  ETHANOL - Abnormal; Notable for the following:    Alcohol, Ethyl (B) 37 (*)    All other components within normal limits  CBC WITH DIFFERENTIAL/PLATELET - Abnormal; Notable for the following:    RBC 4.19 (*)    HCT 36.6 (*)    MCHC 37.4 (*)      Platelets 106 (*)    All other components within normal limits  RAPID URINE DRUG SCREEN, HOSP PERFORMED - Abnormal; Notable for the following:    Benzodiazepines POSITIVE (*)    All other components within normal limits  I-STAT TROPOININ, ED    EKG  EKG Interpretation  Date/Time:  Thursday April 01 2017 01:10:50 EDT Ventricular Rate:  122 PR Interval:    QRS Duration: 92 QT Interval:  323 QTC Calculation: 461 R Axis:   28 Text Interpretation:  Sinus tachycardia Low voltage, precordial leads When compared with ECG of 03/03/2017, No significant change was found Confirmed by Dione Booze ([id number]) on 04/01/2017 1:37:21 AM  Procedures Procedures (including critical care time)  Medications Ordered in ED Medications  LORazepam (ATIVAN) injection 0-4 mg (1 mg Intravenous Given 04/01/17 0342)    Or  LORazepam (ATIVAN) tablet 0-4 mg ( Oral See Alternative 04/01/17 0342)  LORazepam (ATIVAN) injection 0-4 mg (not administered)    Or  LORazepam (ATIVAN) tablet 0-4 mg (not administered)  thiamine (VITAMIN B-1) tablet 100 mg (not administered)    Or  thiamine (B-1) injection 100 mg (not administered)  sodium chloride 0.9 % bolus 1,000 mL (0 mLs Intravenous Stopped 04/01/17 0600)     Initial Impression / Assessment and Plan / ED Course  I have reviewed the triage vital signs and the nursing notes.  Pertinent lab results that were available during my care of the patient were reviewed by me and considered in my medical decision making (see chart for details).  Patient with hypertension and tremulousness consistent with alcohol withdrawal. Old records are reviewed, and he had a recent ED visit for alcohol withdrawal. He is started on CIWA protocol with excellent relief of symptoms. Blood pressure has come down. Laboratory workup shows small amount of ethanol in his system, consistent with ethanol withdrawal. Laboratory workup but also shows mild hyponatremia which is not felt to be  clinically significant. However, clinical significance, AST and ALT and alkaline phosphatase have all increased over his baseline. Bilirubin is also elevated to 3.6. Platelet count is down at 106,000. These are all worrisome for developing cirrhosis of the liver. This was explained to the patient. Currently, there is no indication for inpatient care. He is given a prescription for chlordiazepoxide to manage his with all symptoms for the next several days. He is also given information about both outpatient and residential treatment programs.  Final Clinical Impressions(s) / ED Diagnoses   Final diagnoses:  Alcohol withdrawal syndrome without complication (HCC)  Elevated liver enzymes  Serum total bilirubin elevated  Thrombocytopenia (HCC)    New Prescriptions New Prescriptions   CHLORDIAZEPOXIDE (LIBRIUM) 25 MG CAPSULE    Take 1-2 capsules (25-50 mg total) by mouth 4 (four) times daily as needed for anxiety.   I personally performed the services described in this documentation, which was scribed in my presence. The recorded information has been reviewed and is accurate.       Dione Booze, MD 04/01/17 [phone]

## 2017-04-01 NOTE — ED Triage Notes (Signed)
Patient is alert and oriented x4.  He is being seen for elevated heart rate.  Patient states that this has been an intermittent ongoing issue.  Patient has a Hx of ETOH abuse and states that he has been trying to control it by scaling back.  Patient has tremors present in both hands.

## 2017-04-01 NOTE — Discharge Instructions (Signed)
Your blood tests show that the alcohol you have been drinking is harming your liver. If you continue to drink, you will develop cirrhosis. Please go to one of the detox/rehab facilities to help you get and stay sober.

## 2017-04-15 ENCOUNTER — Ambulatory Visit: Payer: Self-pay | Admitting: Adult Health

## 2017-04-15 DIAGNOSIS — Z0289 Encounter for other administrative examinations: Secondary | ICD-10-CM

## 2017-04-15 NOTE — Progress Notes (Deleted)
   Subjective:    Patient ID: Barry MartinezShaunte L Harris, male    DOB: 03/17/1971, 46 y.o.   MRN: [id number]  HPI  46 year old male who presents to the office today for one month follow up regarding essential hypertension and alcohol withdrawal. During his last visit Cozzar was increased from 50 mg to 100 mg for uncontrolled hypertension.   He was also given a small amount of Ativan to help with his withdrawal symptoms. Since the last time he was seen in the office her went to the ER for symptoms consistent with alcohol withdrawal. He was started on Librium for symptom relief.    AST and ALT and alkaline phosphatase had all increased over his baseline. Bilirubin was also elevated to 3.6. Platelet count is down at 106,000. These are all worrisome for developing cirrhosis of the liver   Today in the office he reports that    Review of Systems     Objective:   Physical Exam        Assessment & Plan:

## 2017-06-08 ENCOUNTER — Ambulatory Visit: Payer: BLUE CROSS/BLUE SHIELD | Admitting: Adult Health

## 2017-07-02 ENCOUNTER — Encounter: Payer: Self-pay | Admitting: Adult Health

## 2017-07-06 ENCOUNTER — Ambulatory Visit (INDEPENDENT_AMBULATORY_CARE_PROVIDER_SITE_OTHER): Payer: BLUE CROSS/BLUE SHIELD | Admitting: Adult Health

## 2017-07-06 ENCOUNTER — Encounter: Payer: Self-pay | Admitting: Adult Health

## 2017-07-06 VITALS — BP 126/90 | HR 124 | Temp 98.9°F | Ht 71.0 in | Wt 266.2 lb

## 2017-07-06 DIAGNOSIS — R200 Anesthesia of skin: Secondary | ICD-10-CM | POA: Diagnosis not present

## 2017-07-06 DIAGNOSIS — F10930 Alcohol use, unspecified with withdrawal, uncomplicated: Secondary | ICD-10-CM

## 2017-07-06 DIAGNOSIS — R202 Paresthesia of skin: Secondary | ICD-10-CM

## 2017-07-06 DIAGNOSIS — F10230 Alcohol dependence with withdrawal, uncomplicated: Secondary | ICD-10-CM

## 2017-07-06 MED ORDER — LORAZEPAM 0.5 MG PO TABS: 0.5000 mg | ORAL_TABLET | Freq: Three times a day (TID) | ORAL | 0 refills | Status: DC | PRN

## 2017-07-06 MED ORDER — CHLORDIAZEPOXIDE HCL 25 MG PO CAPS: 25.0000 mg | ORAL_CAPSULE | Freq: Four times a day (QID) | ORAL | 0 refills | Status: DC | PRN

## 2017-07-06 NOTE — Progress Notes (Signed)
Subjective:    Patient ID: Barry Harris, male    DOB: [birth year], 46 y.o.   MRN: [id number]  HPI  46 year old male who  has a past medical history of Alcohol abuse; Elevated liver enzymes; and Hypertension. He presents to the office today for the complaint of nausea and not having an appetite x 2 days. He reports that he feels as though this may be a withdrawal symptoms, he reports that he recently started drinking again, he has been sober since June 2018. He reports drinking a 5th of Vodka plus multiple beers every day for 3-4 days and then quit drinking 2 days ago. He denies any vomiting, diarrhea, or blood in stool  He also reports numbness and tingling in his feet when he wakes up in the morning. This sensation goes away throughout the day. He is concerned that he may have diabetes, as he has a strong family history of diabetes. He denies any loss of sensation or issues with bowel or bladder. He does have some episodes of low back pain.    Review of Systems See HPI   Past Medical History:  Diagnosis Date  . Alcohol abuse   . Elevated liver enzymes   . Hypertension     Social History   Social History  . Marital status: Single    Spouse name: N/A  . Number of children: N/A  . Years of education: N/A   Occupational History  . [employer]   Social History Main Topics  . Smoking status: Former Smoker    Quit date: 04/20/2001  . Smokeless tobacco: Never Used  . Alcohol use 1.5 oz/week    3 Standard drinks or equivalent per week     Comment: former drinker/ drinking problem /  . Drug use: No  . Sexual activity: Not Currently   Other Topics Concern  . 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       Exercise: Lifts weights some. Rides stationary bike 2-3 times per week -but does this in spurts.                    Does not consistently exercise and stay on routine.        Diet: Fairly careful with what he eats.    Past Surgical History:  Procedure Laterality Date  . SPINE SURGERY     46yo    Family History  Problem Relation Age of Onset  . Cancer Father        prostate  . Asthma Sister   . Diabetes Sister   . Diabetes Maternal Grandmother   . Heart disease Maternal Grandmother   . Diabetes Mother   . Hypertension Mother   . Breast cancer Sister     No Known Allergies  Current Outpatient Prescriptions on File Prior to Visit  Medication Sig Dispense Refill  . Multiple Vitamin (MULTIVITAMIN WITH MINERALS) TABS tablet Take 1 tablet by mouth daily.     No current facility-administered medications on file prior to visit.     BP 126/90 (BP Location: Left Arm, Patient Position: Sitting, Cuff Size: Large)   Pulse (!) 124   Temp 98.9 F (37.2 C) (Oral)   Ht  (1.803 m)   Wt 266 lb 4 oz (120.8 kg)   SpO2 96%   BMI 37.13 kg/m  Objective:   Physical Exam  Constitutional: He is oriented to person, place, and time. He appears well-developed and well-nourished. No distress.  Cardiovascular: Normal rate, regular rhythm, normal heart sounds and intact distal pulses.  Exam reveals no gallop and no friction rub.   No murmur heard. Pulmonary/Chest: Effort normal and breath sounds normal. No respiratory distress. He has no wheezes. He has no rales. He exhibits no tenderness.  Musculoskeletal: Normal range of motion. He exhibits no edema or tenderness.  Neurological: He is alert and oriented to person, place, and time. He has normal reflexes. He displays normal reflexes. No cranial nerve deficit. He exhibits normal muscle tone. Coordination normal.  Skin: Skin is warm and dry. No rash noted. He is not diaphoretic. No erythema. No pallor.  Psychiatric: He has a normal mood and affect. His behavior is normal. Judgment and thought content normal.  Nursing note and vitals reviewed.     Assessment & Plan:  1. Alcohol withdrawal syndrome  without complication (HCC) - Advised AA or other alcohol abuse treatment facilities. He is willing to start AA. Will send in a refill of Librium and Ativan  - LORazepam (ATIVAN) 0.5 MG tablet; Take 1 tablet (0.5 mg total) by mouth every 8 (eight) hours as needed for anxiety.  Dispense: 60 tablet; Refill: 0 - chlordiazePOXIDE (LIBRIUM) 25 MG capsule; Take 1-2 capsules (25-50 mg total) by mouth 4 (four) times daily as needed for anxiety.  Dispense: 16 capsule; Refill: 0 - Basic metabolic panel; Future - Follow up as needed  2. Numbness and tingling of both lower extremities - possible lumbar radiculopathy. He does not want to do an xray at this time.  - Basic metabolic panel; Future - Hemoglobin A1c  Shirline Frees, NP

## 2017-07-06 NOTE — Patient Instructions (Addendum)
[address] Answering Service-[place], [place] Hotline: [phone] Site: [email or web address]

## 2017-07-07 LAB — HEMOGLOBIN A1C: Hgb A1c MFr Bld: 5.8 % (ref 4.6–6.5)

## 2017-07-07 LAB — BASIC METABOLIC PANEL
BUN: 14 mg/dL (ref 6–23)
CHLORIDE: 100 meq/L (ref 96–112)
CO2: 24 meq/L (ref 19–32)
CREATININE: 0.83 mg/dL (ref 0.40–1.50)
Calcium: 8.9 mg/dL (ref 8.4–10.5)
GFR: 128.92 mL/min (ref >60.00)
GLUCOSE: 198 mg/dL — AB (ref 70–99)
Potassium: 3.6 mEq/L (ref 3.5–5.1)
Sodium: 134 mEq/L — ABNORMAL LOW (ref 135–145)

## 2017-07-19 ENCOUNTER — Ambulatory Visit (INDEPENDENT_AMBULATORY_CARE_PROVIDER_SITE_OTHER): Payer: BLUE CROSS/BLUE SHIELD | Admitting: Adult Health

## 2017-07-19 ENCOUNTER — Encounter: Payer: Self-pay | Admitting: Adult Health

## 2017-07-19 VITALS — BP 174/96 | HR 120 | Temp 98.4°F | Wt 274.0 lb

## 2017-07-19 DIAGNOSIS — F1010 Alcohol abuse, uncomplicated: Secondary | ICD-10-CM | POA: Diagnosis not present

## 2017-07-19 MED ORDER — OMEPRAZOLE 20 MG PO CPDR: 20.0000 mg | DELAYED_RELEASE_CAPSULE | Freq: Every day | ORAL | 3 refills | Status: DC

## 2017-07-19 MED ORDER — DIAZEPAM 5 MG PO TABS: ORAL_TABLET | ORAL | 0 refills | Status: DC

## 2017-07-19 NOTE — Progress Notes (Signed)
Subjective:    Patient ID: Barry Harris, male    DOB: 07-04-1971, 46 y.o.   MRN: [id number]  HPI   46 year old male who  has a past medical history of Alcohol abuse; Elevated liver enzymes; and Hypertension. He presents to the office today for alcohol withdrawal. His last drink was two days ago. His symptoms include that of nausea, rapid heart rate, and upper body tremors. I last saw him about two weeks ago for the same issues. He was prescribed Ativan and Librium ( which he has been prescribed in the past.) He reports that the librium is causing him to have vivid dreams. He is has taken the Ativan but finds that he is still going through withdrawal symptoms.   During his last visit we talked about AA. He went for one visit and observed. He did find it helpful but has not returned.    BP Readings from Last 3 Encounters:  07/19/17 (!) 174/96  07/06/17 126/90  04/01/17 (!) 148/72     Review of Systems See HPI   Past Medical History:  Diagnosis Date  . Alcohol abuse   . Elevated liver enzymes   . Hypertension     Social History   Social History  . Marital status: Single    Spouse name: N/A  . Number of children: N/A  . Years of education: N/A   Occupational History  . [employer]   Social History Main Topics  . Smoking status: Former Smoker    Quit date: 04/20/2001  . Smokeless tobacco: Never Used  . Alcohol use 1.5 oz/week    3 Standard drinks or equivalent per week     Comment: former drinker/ drinking problem /  . Drug use: No  . Sexual activity: Not Currently   Other Topics Concern  . 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       Exercise: Lifts weights some. Rides stationary bike 2-3 times per week -but does this in spurts.                    Does not consistently exercise and stay on routine.      Diet: Fairly careful with what he eats.     Past Surgical History:  Procedure Laterality Date  . SPINE SURGERY     46yo    Family History  Problem Relation Age of Onset  . Cancer Father        prostate  . Asthma Sister   . Diabetes Sister   . Diabetes Maternal Grandmother   . Heart disease Maternal Grandmother   . Diabetes Mother   . Hypertension Mother   . Breast cancer Sister     No Known Allergies  Current Outpatient Prescriptions on File Prior to Visit  Medication Sig Dispense Refill  . losartan (COZAAR) 50 MG tablet   0  . Multiple Vitamin (MULTIVITAMIN WITH MINERALS) TABS tablet Take 1 tablet by mouth daily.    . chlordiazePOXIDE (LIBRIUM) 25 MG capsule Take 1-2 capsules (25-50 mg total) by mouth 4 (four) times daily as needed for anxiety. (Patient not taking: Reported on 07/19/2017) 16 capsule 0   No current facility-administered medications on file prior to visit.     BP (!) 174/96 (BP Location: Right Arm)   Pulse (!) 120   Temp 98.4 F (36.9  C) (Oral)   Wt 274 lb (124.3 kg)   SpO2 98%   BMI 38.22 kg/m       Objective:   Physical Exam  Constitutional: He is oriented to person, place, and time. He appears well-developed and well-nourished. No distress.  Cardiovascular: Normal rate, regular rhythm, normal heart sounds and intact distal pulses.  Exam reveals no gallop and no friction rub.   No murmur heard. Pulmonary/Chest: Effort normal and breath sounds normal. No respiratory distress. He has no wheezes. He has no rales. He exhibits no tenderness.  Abdominal: Soft. Bowel sounds are normal. He exhibits no distension and no mass. There is no tenderness. There is no rebound and no guarding.  Neurological: He is alert and oriented to person, place, and time. He displays tremor. No cranial nerve deficit or sensory deficit. He exhibits normal muscle tone.  Skin: Skin is warm and dry. No rash noted. He is not diaphoretic. No erythema. No pallor.  Psychiatric: He has a normal mood and affect. His behavior  is normal. Judgment and thought content normal.  Nursing note and vitals reviewed.     Assessment & Plan:  1. Alcohol abuse - D/c Ativan and start valium  - Advised he needs to return to AA and go every day.  - diazepam (VALIUM) 5 MG tablet; Take 1-2 tablets every 6 hours for alcohol withdrawal  Dispense: 60 tablet; Refill: 0 - Advised other outside resources but he did not want any at this time   Shirline Frees, NP

## 2017-07-20 ENCOUNTER — Encounter: Payer: Self-pay | Admitting: Adult Health

## 2017-07-20 ENCOUNTER — Encounter (HOSPITAL_COMMUNITY): Payer: Self-pay | Admitting: *Deleted

## 2017-07-20 ENCOUNTER — Emergency Department (HOSPITAL_COMMUNITY): Payer: BLUE CROSS/BLUE SHIELD

## 2017-07-20 ENCOUNTER — Emergency Department (HOSPITAL_COMMUNITY)
Admission: EM | Admit: 2017-07-20 | Discharge: 2017-07-20 | Disposition: A | Discharge status: Home / Self Care | Payer: BLUE CROSS/BLUE SHIELD | Attending: Emergency Medicine | Admitting: Emergency Medicine

## 2017-07-20 ENCOUNTER — Other Ambulatory Visit: Payer: Self-pay | Admitting: Adult Health

## 2017-07-20 DIAGNOSIS — Z79899 Other long term (current) drug therapy: Secondary | ICD-10-CM | POA: Diagnosis not present

## 2017-07-20 DIAGNOSIS — Z87891 Personal history of nicotine dependence: Secondary | ICD-10-CM | POA: Diagnosis not present

## 2017-07-20 DIAGNOSIS — R002 Palpitations: Secondary | ICD-10-CM | POA: Insufficient documentation

## 2017-07-20 DIAGNOSIS — I10 Essential (primary) hypertension: Secondary | ICD-10-CM | POA: Diagnosis not present

## 2017-07-20 HISTORY — DX: Unspecified atrial fibrillation: I48.91

## 2017-07-20 LAB — CBC
HCT: 37.1 % — ABNORMAL LOW (ref 39.0–52.0)
Hemoglobin: 13.7 g/dL (ref 13.0–17.0)
MCH: 29.7 pg (ref 26.0–34.0)
MCHC: 36.9 g/dL — ABNORMAL HIGH (ref 30.0–36.0)
MCV: 80.5 fL (ref 78.0–100.0)
PLATELETS: 76 10*3/uL — AB (ref 150–400)
RBC: 4.61 MIL/uL (ref 4.22–5.81)
RDW: 15.7 % — ABNORMAL HIGH (ref 11.5–15.5)
WBC: 6.4 10*3/uL (ref 4.0–10.5)

## 2017-07-20 LAB — HEPATIC FUNCTION PANEL
ALBUMIN: 3.2 g/dL — AB (ref 3.5–5.0)
ALK PHOS: 169 U/L — AB (ref 38–126)
ALT: 173 U/L — AB (ref 17–63)
AST: 334 U/L — ABNORMAL HIGH (ref 15–41)
BILIRUBIN INDIRECT: 1.8 mg/dL — AB (ref 0.3–0.9)
Bilirubin, Direct: 1.5 mg/dL — ABNORMAL HIGH (ref 0.1–0.5)
TOTAL PROTEIN: 7.8 g/dL (ref 6.5–8.1)
Total Bilirubin: 3.3 mg/dL — ABNORMAL HIGH (ref 0.3–1.2)

## 2017-07-20 LAB — BASIC METABOLIC PANEL
Anion gap: 12 (ref 5–15)
BUN: 8 mg/dL (ref 6–20)
CO2: 23 mmol/L (ref 22–32)
CREATININE: 0.83 mg/dL (ref 0.61–1.24)
Calcium: 8.9 mg/dL (ref 8.9–10.3)
Chloride: 96 mmol/L — ABNORMAL LOW (ref 101–111)
Glucose, Bld: 134 mg/dL — ABNORMAL HIGH (ref 65–99)
POTASSIUM: 4.5 mmol/L (ref 3.5–5.1)
SODIUM: 131 mmol/L — AB (ref 135–145)

## 2017-07-20 LAB — MAGNESIUM: MAGNESIUM: 2 mg/dL (ref 1.7–2.4)

## 2017-07-20 MED ORDER — POTASSIUM CHLORIDE CRYS ER 20 MEQ PO TBCR
40.0000 meq | EXTENDED_RELEASE_TABLET | Freq: Once | ORAL | Status: AC
  Administered 2017-07-20: 40 meq via ORAL
  Filled 2017-07-20: qty 2

## 2017-07-20 MED ORDER — CHLORDIAZEPOXIDE HCL 25 MG PO CAPS
25.0000 mg | ORAL_CAPSULE | Freq: Once | ORAL | Status: AC
  Administered 2017-07-20: 25 mg via ORAL
  Filled 2017-07-20: qty 1

## 2017-07-20 MED ORDER — METOPROLOL TARTRATE 25 MG PO TABS
25.0000 mg | ORAL_TABLET | Freq: Once | ORAL | Status: AC
  Administered 2017-07-20: 25 mg via ORAL
  Filled 2017-07-20: qty 1

## 2017-07-20 MED ORDER — SODIUM CHLORIDE 0.9 % IV BOLUS (SEPSIS)
1000.0000 mL | Freq: Once | INTRAVENOUS | Status: AC
  Administered 2017-07-20: 1000 mL via INTRAVENOUS

## 2017-07-20 MED ORDER — METOPROLOL TARTRATE 25 MG PO TABS: 25.0000 mg | ORAL_TABLET | Freq: Two times a day (BID) | ORAL | 0 refills | Status: DC

## 2017-07-20 NOTE — Discharge Instructions (Signed)
You had some changes on your chest x ray that will need to be rechecked by your family doctor with repeat chest xray.  Please get rechecked immediately if you develop fevers, chest pain, difficulty breathing or new concerning symptoms.

## 2017-07-20 NOTE — ED Notes (Signed)
Pt ambulated to RR in pt room with steady gait.

## 2017-07-20 NOTE — ED Notes (Signed)
Patient is in a gown resting with call bell in reach on the monitor

## 2017-07-20 NOTE — ED Provider Notes (Signed)
[facility]EMERGENCY DEPT Provider Note   CSN: [id number] Arrival date & time: 07/20/17  9147     History   Chief Complaint Chief Complaint  Patient presents with  . Palpitations  . Alcohol Problem    HPI Barry Harris is a 46 y.o. male.  The history is provided by the patient. No language interpreter was used.  Palpitations    Alcohol Problem    Barry Harris is a 46 y.o. male who presents to the Emergency Department complaining of Palpitations. He has a history of alcohol abuse. He had been sober until about a week ago when he had a binge alcohol drinking a pint of liquor daily. He stopped working on Saturday. Yesterday he began to feel palpitations with irregular rapid heartbeat. He denies any chest pain or shortness of breath. He saw his PCP and has been started on Valium as needed for alcohol withdrawal. He is currently on losartan for hypertension. He states that in the past he has had palpitations and metoprolol has significantly helped. Past Medical History:  Diagnosis Date  . Alcohol abuse   . Atrial fibrillation (HCC)   . Elevated liver enzymes   . Hypertension     Patient Active Problem List   Diagnosis Date Noted  . Hypertriglyceridemia 12/04/2014  . Palpitations 11/30/2014  . Elevated transaminase level 11/30/2014  . Alcohol abuse 11/30/2014  . Hyponatremia 11/30/2014  . Dehydration   . Transaminitis   . GAD (generalized anxiety disorder) 06/12/2014  . Obesity 05/08/2014  . Hypertension     Past Surgical History:  Procedure Laterality Date  . SPINE SURGERY     46yo       Home Medications    Prior to Admission medications   Medication Sig Start Date End Date Taking? Authorizing Provider  diazepam (VALIUM) 5 MG tablet Take 1-2 tablets every 6 hours for alcohol withdrawal Patient taking differently: Take 5-10 mg by mouth every 6 (six) hours. Take 1-2 tablets every 6 hours for alcohol withdrawal 07/19/17  Yes Nafziger, Kandee Keen, NP  Multiple  Vitamin (MULTIVITAMIN WITH MINERALS) TABS tablet Take 1 tablet by mouth daily.   Yes [provider]  omeprazole (PRILOSEC) 20 MG capsule Take 1 capsule (20 mg total) by mouth daily. 07/19/17  Yes Nafziger, Kandee Keen, NP  chlordiazePOXIDE (LIBRIUM) 25 MG capsule Take 1-2 capsules (25-50 mg total) by mouth 4 (four) times daily as needed for anxiety. Patient not taking: Reported on 07/19/2017 07/06/17   Shirline Frees, NP  metoprolol tartrate (LOPRESSOR) 25 MG tablet Take 1 tablet (25 mg total) by mouth 2 (two) times daily. 07/20/17   Tilden Fossa, MD    Family History Family History  Problem Relation Age of Onset  . Cancer Father        prostate  . Asthma Sister   . Diabetes Sister   . Diabetes Maternal Grandmother   . Heart disease Maternal Grandmother   . Diabetes Mother   . Hypertension Mother   . Breast cancer Sister     Social History Social History  Substance Use Topics  . Smoking status: Former Smoker    Quit date: 04/20/2001  . Smokeless tobacco: Never Used  . Alcohol use 1.5 oz/week    3 Standard drinks or equivalent per week     Comment: former drinker/ drinking problem /     Allergies   Patient has no known allergies.   Review of Systems Review of Systems  Cardiovascular: Positive for palpitations.  All other systems reviewed  and are negative.    Physical Exam Updated Vital Signs BP 139/84   Pulse (!) 107   Temp 98.3 F (36.8 C) (Oral)   Resp 17   SpO2 98%   Physical Exam  Constitutional: He is oriented to person, place, and time. He appears well-developed and well-nourished.  HENT:  Head: Normocephalic and atraumatic.  Cardiovascular: Regular rhythm.   No murmur heard. Tachycardiac  Pulmonary/Chest: Effort normal and breath sounds normal. No respiratory distress.  Abdominal: Soft. There is no tenderness. There is no rebound and no guarding.  Musculoskeletal: He exhibits no edema or tenderness.  Neurological: He is alert and oriented to  person, place, and time.  Skin: Skin is warm and dry.  Psychiatric: He has a normal mood and affect. His behavior is normal.  Nursing note and vitals reviewed.    ED Treatments / Results  Labs (all labs ordered are listed, but only abnormal results are displayed) Labs Reviewed  CBC - Abnormal; Notable for the following:       Result Value   HCT 37.1 (*)    MCHC 36.9 (*)    RDW 15.7 (*)    Platelets 76 (*)    All other components within normal limits  BASIC METABOLIC PANEL - Abnormal; Notable for the following:    Sodium 131 (*)    Chloride 96 (*)    Glucose, Bld 134 (*)    All other components within normal limits  HEPATIC FUNCTION PANEL - Abnormal; Notable for the following:    Albumin 3.2 (*)    AST 334 (*)    ALT 173 (*)    Alkaline Phosphatase 169 (*)    Total Bilirubin 3.3 (*)    Bilirubin, Direct 1.5 (*)    Indirect Bilirubin 1.8 (*)    All other components within normal limits  MAGNESIUM  I-STAT TROPONIN, ED    EKG  EKG Interpretation  Date/Time:  Tuesday July 20 2017 07:43:07 EDT Ventricular Rate:  117 PR Interval:  148 QRS Duration: 90 QT Interval:  314 QTC Calculation: 438 R Axis:   39 Text Interpretation:  Sinus tachycardia Cannot rule out Anterior infarct , age undetermined T wave abnormality, consider inferior ischemia Abnormal ECG Confirmed by Tilden Fossa [id number]) on 07/20/2017 9:52:23 AM       Radiology Dg Chest 2 View  Result Date: 07/20/2017 CLINICAL DATA:  Cardiac palpitations. EXAM: CHEST  2 VIEW COMPARISON:  CT 10/16/2016.  Chest x-ray 10/16/2016 . FINDINGS: Mediastinum and hilar structures are normal. Heart size normal. Questionable nodular densities right upper lobe. Follow-up PA lateral chest x-ray suggested demonstrate resolution. If densities persist nonenhanced chest CT suggested for further evaluation. No pleural effusion or pneumothorax. Heart size normal. IMPRESSION: Questionable nodular nodular densities right upper lobe.  Follow-up PA lateral chest x-ray suggested to demonstrate resolution. Densities persist nonenhanced chest CT suggested for further evaluation . Electronically Signed   By: Maisie Fus  Register   On: 07/20/2017 08:23    Procedures Procedures (including critical care time)  Medications Ordered in ED Medications  metoprolol tartrate (LOPRESSOR) tablet 25 mg (not administered)  sodium chloride 0.9 % bolus 1,000 mL (0 mLs Intravenous Stopped 07/20/17 1225)  chlordiazePOXIDE (LIBRIUM) capsule 25 mg (25 mg Oral Given 07/20/17 1019)  potassium chloride SA (K-DUR,KLOR-CON) CR tablet 40 mEq (40 mEq Oral Given 07/20/17 1226)     Initial Impression / Assessment and Plan / ED Course  I have reviewed the triage vital signs and the nursing notes.  Pertinent labs &  imaging results that were available during my care of the patient were reviewed by me and considered in my medical decision making (see chart for details).     Patient with history of alcohol abuse here for evaluation of palpitations. He is tachycardic in the emergency department but in no acute distress. Chest x-ray with possible changes to the right upper lobe, no evidence of acute pneumonia. Presentation is not consistent with PE or ACS. He has had similar presenting symptoms in the past with negative CTA. Labs are similar compared to priors with thrombocytopenia, transaminitis. Patient is not in active EtOH withdrawal in the department. Plan to treat symptoms with metoprolol. Will discontinue his losartan for now. Plan to have him follow up closely with PCP, he may need his metoprolol titrated up. Discussed close return precautions.  Final Clinical Impressions(s) / ED Diagnoses   Final diagnoses:  Palpitations    New Prescriptions New Prescriptions   METOPROLOL TARTRATE (LOPRESSOR) 25 MG TABLET    Take 1 tablet (25 mg total) by mouth 2 (two) times daily.     Tilden Fossa, MD 07/20/17 [phone]

## 2017-07-20 NOTE — ED Triage Notes (Signed)
Pt reports hx of afib. Having palpitations this am. HR 120s and regular at triage. Reports withdrawal from alcohol, last drank Saturday. Denies SI or HI.

## 2017-07-22 ENCOUNTER — Encounter: Payer: Self-pay | Admitting: Adult Health

## 2017-07-22 ENCOUNTER — Ambulatory Visit (INDEPENDENT_AMBULATORY_CARE_PROVIDER_SITE_OTHER): Payer: BLUE CROSS/BLUE SHIELD | Admitting: Adult Health

## 2017-07-22 VITALS — BP 148/90 | Temp 98.2°F | Wt 273.0 lb

## 2017-07-22 DIAGNOSIS — R911 Solitary pulmonary nodule: Secondary | ICD-10-CM | POA: Diagnosis not present

## 2017-07-22 DIAGNOSIS — R002 Palpitations: Secondary | ICD-10-CM

## 2017-07-22 NOTE — Progress Notes (Signed)
Subjective:    Patient ID: Barry Harris, male    DOB: [birth year], 46 y.o.   MRN: [id number]  HPI  46 year old male who  has a past medical history of Alcohol abuse; Atrial fibrillation (HCC); Elevated liver enzymes; and Hypertension. He is being seen today for follow up after being seen in the ER two days ago for Palpitations. He was started on Metoprolol 25 mg   Chest x ray showed "Questionable nodular densities right upper lobe" It was recommended that he follow up with PA lateral chest and if needed a non con CT scan   Labs were at baseline   Today in the office he reports that he feels better since starting Metoprolol. He is no longer having palpitations. He is also happy to report that he is not going through alcohol withdrawal and has not had a drink since the last time we saw each other.   He is worried about the " spots on my lungs"  Review of Systems See HPI   Past Medical History:  Diagnosis Date  . Alcohol abuse   . Atrial fibrillation (HCC)   . Elevated liver enzymes   . Hypertension     Social History   Social History  . Marital status: Single    Spouse name: N/A  . Number of children: N/A  . Years of education: N/A   Occupational History  . [employer]   Social History Main Topics  . Smoking status: Former Smoker    Quit date: 04/20/2001  . Smokeless tobacco: Never Used  . Alcohol use 1.5 oz/week    3 Standard drinks or equivalent per week     Comment: former drinker/ drinking problem /  . Drug use: No  . Sexual activity: Not Currently   Other Topics Concern  . 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       Exercise: Lifts weights some. Rides stationary bike 2-3 times per week -but does this in spurts.                    Does not consistently exercise and stay on routine.      Diet: Fairly careful with what he eats.    Past Surgical  History:  Procedure Laterality Date  . SPINE SURGERY     46yo    Family History  Problem Relation Age of Onset  . Cancer Father        prostate  . Asthma Sister   . Diabetes Sister   . Diabetes Maternal Grandmother   . Heart disease Maternal Grandmother   . Diabetes Mother   . Hypertension Mother   . Breast cancer Sister     No Known Allergies  Current Outpatient Prescriptions on File Prior to Visit  Medication Sig Dispense Refill  . metoprolol tartrate (LOPRESSOR) 25 MG tablet Take 1 tablet (25 mg total) by mouth 2 (two) times daily. 30 tablet 0  . Multiple Vitamin (MULTIVITAMIN WITH MINERALS) TABS tablet Take 1 tablet by mouth daily.    Marland Kitchen omeprazole (PRILOSEC) 20 MG capsule Take 1 capsule (20 mg total) by mouth daily. 30 capsule 3  . chlordiazePOXIDE (LIBRIUM) 25 MG capsule Take 1-2 capsules (25-50 mg total) by mouth 4 (four) times daily as needed for anxiety. (Patient not taking: Reported on 07/19/2017) 16 capsule 0  .  diazepam (VALIUM) 5 MG tablet Take 1-2 tablets every 6 hours for alcohol withdrawal (Patient not taking: Reported on 07/22/2017) 60 tablet 0   No current facility-administered medications on file prior to visit.     BP (!) 148/90 (BP Location: Left Arm)   Temp 98.2 F (36.8 C) (Oral)   Wt 273 lb (123.8 kg)   BMI 38.08 kg/m       Objective:   Physical Exam  Constitutional: He is oriented to person, place, and time. He appears well-developed and well-nourished. No distress.  Cardiovascular: Normal rate, regular rhythm, normal heart sounds and intact distal pulses.  Exam reveals no gallop.   No murmur heard. Pulmonary/Chest: Effort normal and breath sounds normal. No respiratory distress. He has no wheezes. He has no rales. He exhibits no tenderness.  Neurological: He is alert and oriented to person, place, and time.  Skin: Skin is warm and dry. No rash noted. He is not diaphoretic. No erythema. No pallor.  Psychiatric: He has a normal mood and affect.  His behavior is normal. Judgment and thought content normal.  Nursing note and vitals reviewed.     Assessment & Plan:  1. Heart palpitations - Continue with Metoprolol  - Encouraged to go to AA this week and weekend  - Follow up as needed  2. Incidental lung nodule - DG Chest 2 View; Future - Consider CT scan if not resolved   Shirline Frees, NP

## 2017-08-04 ENCOUNTER — Other Ambulatory Visit: Payer: Self-pay | Admitting: Adult Health

## 2017-08-05 NOTE — Telephone Encounter (Signed)
Six months

## 2017-08-05 NOTE — Telephone Encounter (Signed)
Unsure how long to fill

## 2017-08-05 NOTE — Telephone Encounter (Signed)
Sent to the pharmacy by e-scribe. 

## 2017-11-01 ENCOUNTER — Other Ambulatory Visit: Payer: Self-pay | Admitting: Adult Health

## 2017-11-03 NOTE — Telephone Encounter (Signed)
FILLED FOR 6 MONTHS 07/2017.  REFILL REQUEST IS TOO EARLY.

## 2017-11-07 ENCOUNTER — Other Ambulatory Visit: Payer: Self-pay | Admitting: Adult Health

## 2017-11-09 ENCOUNTER — Other Ambulatory Visit: Payer: Self-pay | Admitting: Family Medicine

## 2017-11-09 MED ORDER — OMEPRAZOLE 20 MG PO CPDR: 20.0000 mg | DELAYED_RELEASE_CAPSULE | Freq: Every day | ORAL | 1 refills | Status: DC

## 2017-11-09 NOTE — Telephone Encounter (Signed)
FILLED FOR 6 MONTHS IN OCT 2018.  REFILL REQUEST IS TOO EARLY.  MESSAGE SENT TO THE PHARMACY.

## 2017-11-11 ENCOUNTER — Other Ambulatory Visit: Payer: Self-pay

## 2017-11-11 MED ORDER — METOPROLOL TARTRATE 25 MG PO TABS: 25.0000 mg | ORAL_TABLET | Freq: Two times a day (BID) | ORAL | 0 refills | Status: DC

## 2017-12-09 ENCOUNTER — Other Ambulatory Visit: Payer: Self-pay | Admitting: Family Medicine

## 2017-12-10 ENCOUNTER — Encounter: Payer: Self-pay | Admitting: Family Medicine

## 2017-12-10 MED ORDER — METOPROLOL TARTRATE 25 MG PO TABS: 25.0000 mg | ORAL_TABLET | Freq: Two times a day (BID) | ORAL | 0 refills | Status: DC

## 2017-12-10 NOTE — Telephone Encounter (Signed)
Ok to refill for 30 days. Will need CPE for further refills

## 2018-01-07 ENCOUNTER — Other Ambulatory Visit: Payer: Self-pay | Admitting: Adult Health

## 2018-03-08 ENCOUNTER — Encounter: Payer: Self-pay | Admitting: Adult Health

## 2018-03-08 DIAGNOSIS — Z0289 Encounter for other administrative examinations: Secondary | ICD-10-CM

## 2018-03-08 NOTE — Progress Notes (Deleted)
Subjective:    Patient ID: Barry Harris, male    DOB: [birth year], 47 y.o.   MRN: [id number]  HPI Patient presents for yearly preventative medicine examination. He is a pleasant 47 year old male who  has a past medical history of Alcohol abuse, Atrial fibrillation (HCC), Elevated liver enzymes, and Hypertension.  Palpitations - takes Metoprolol - well controlled.   Alcohol Abuse- longstanding issue.   Hypertension -  BP Readings from Last 3 Encounters:  07/22/17 (!) 148/90  07/20/17 139/84  07/19/17 (!) 174/96   Lung Nodule - this was an incidental finding when he was seen in the ER for palpitations in 07/2017. It was recommended that he had a follow up PA and Lat done to look for resolution. He did not have this done.  All immunizations and health maintenance protocols were reviewed with the patient and needed orders were placed.He is up to date on vaccinations.   Appropriate screening laboratory values were ordered for the patient including screening of hyperlipidemia, renal function and hepatic function. If indicated by BPH, a PSA was ordered.  Medication reconciliation,  past medical history, social history, problem list and allergies were reviewed in detail with the patient  Goals were established with regard to weight loss, exercise, and  diet in compliance with medications  End of life planning was discussed.  Review of Systems  Constitutional: Negative.   HENT: Negative.   Eyes: Negative.   Respiratory: Negative.   Cardiovascular: Negative.   Gastrointestinal: Negative.   Endocrine: Negative.   Genitourinary: Negative.   Musculoskeletal: Negative.   Skin: Negative.   Allergic/Immunologic: Negative.   Neurological: Negative.   Hematological: Negative.   Psychiatric/Behavioral: Negative.   All other systems reviewed and are negative.  Past Medical History:  Diagnosis Date  . Alcohol abuse   . Atrial fibrillation (HCC)   . Elevated liver enzymes   .  Hypertension     Social History   Socioeconomic History  . Marital status: Single    Spouse name: Not on file  . Number of children: Not on file  . Years of education: Not on file  . Highest education level: Not on file  Occupational History  . Occupation: [employer] for [employer]    Employer: [employer]  Social Needs  . Financial resource strain: Not on file  . Food insecurity:    Worry: Not on file    Inability: Not on file  . Transportation needs:    Medical: Not on file    Non-medical: Not on file  Tobacco Use  . Smoking status: Former Smoker    Last attempt to quit: 04/20/2001    Years since quitting: 16.8  . Smokeless tobacco: Never Used  Substance and Sexual Activity  . Alcohol use: Yes    Alcohol/week: 1.5 oz    Types: 3 Standard drinks or equivalent per week    Comment: former drinker/ drinking problem /  . Drug use: No  . Sexual activity: Not Currently  Lifestyle  . Physical activity:    Days per week: Not on file    Minutes per session: Not on file  . Stress: Not on file  Relationships  . Social connections:    Talks on phone: Not on file    Gets together: Not on file    Attends religious service: Not on file    Active member of club or organization: Not on file    Attends meetings of clubs or organizations: Not on file  Relationship status: Not on file  . Intimate partner violence:    Fear of current or ex partner: Not on file    Emotionally abused: Not on file    Physically abused: Not on file    Forced sexual activity: Not on file  Other Topics Concern  . 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       Exercise: Lifts weights some. Rides stationary bike 2-3 times per week -but does this in spurts.                    Does not consistently exercise and stay on routine.      Diet: Fairly careful with what he eats.    Past Surgical History:  Procedure Laterality Date   . SPINE SURGERY     47yo    Family History  Problem Relation Age of Onset  . Cancer Father        prostate  . Asthma Sister   . Diabetes Sister   . Diabetes Maternal Grandmother   . Heart disease Maternal Grandmother   . Diabetes Mother   . Hypertension Mother   . Breast cancer Sister     No Known Allergies  Current Outpatient Medications on File Prior to Visit  Medication Sig Dispense Refill  . chlordiazePOXIDE (LIBRIUM) 25 MG capsule Take 1-2 capsules (25-50 mg total) by mouth 4 (four) times daily as needed for anxiety. (Patient not taking: Reported on 07/19/2017) 16 capsule 0  . diazepam (VALIUM) 5 MG tablet Take 1-2 tablets every 6 hours for alcohol withdrawal (Patient not taking: Reported on 07/22/2017) 60 tablet 0  . metoprolol tartrate (LOPRESSOR) 25 MG tablet Take 1 tablet (25 mg total) by mouth 2 (two) times daily. 60 tablet 0  . Multiple Vitamin (MULTIVITAMIN WITH MINERALS) TABS tablet Take 1 tablet by mouth daily.    Marland Kitchen omeprazole (PRILOSEC) 20 MG capsule Take 1 capsule (20 mg total) by mouth daily. 90 capsule 1   No current facility-administered medications on file prior to visit.     There were no vitals taken for this visit.      Objective:   Physical Exam  Constitutional: He is oriented to person, place, and time. He appears well-developed and well-nourished. No distress.  HENT:  Head: Normocephalic and atraumatic.  Right Ear: External ear normal.  Left Ear: External ear normal.  Nose: Nose normal.  Mouth/Throat: Oropharynx is clear and moist. No oropharyngeal exudate.  Eyes: Pupils are equal, round, and reactive to light. Conjunctivae and EOM are normal. Right eye exhibits no discharge. Left eye exhibits no discharge. No scleral icterus.  Neck: Normal range of motion. Neck supple. No JVD present. No tracheal deviation present. No thyromegaly present.  Cardiovascular: Normal rate, regular rhythm, normal heart sounds and intact distal pulses. Exam reveals no  gallop and no friction rub.  No murmur heard. Pulmonary/Chest: Effort normal and breath sounds normal. No respiratory distress. He has no wheezes. He has no rales. He exhibits no tenderness.  Abdominal: Soft. Bowel sounds are normal. He exhibits no distension and no mass. There is no tenderness. There is no rebound and no guarding. No hernia.  Musculoskeletal: Normal range of motion. He exhibits no edema, tenderness or deformity.  Lymphadenopathy:    He has no cervical adenopathy.  Neurological: He is alert and oriented to person, place, and time. He displays  normal reflexes. No cranial nerve deficit or sensory deficit. He exhibits normal muscle tone. Coordination normal.  Skin: Skin is warm and dry. Capillary refill takes less than 2 seconds. No rash noted. He is not diaphoretic. No erythema. No pallor.  Psychiatric: He has a normal mood and affect. His behavior is normal. Judgment and thought content normal.  Nursing note and vitals reviewed.     Assessment & Plan:

## 2018-11-03 ENCOUNTER — Encounter: Payer: Self-pay | Admitting: Internal Medicine

## 2018-11-03 ENCOUNTER — Ambulatory Visit (INDEPENDENT_AMBULATORY_CARE_PROVIDER_SITE_OTHER): Payer: Self-pay | Admitting: Internal Medicine

## 2018-11-03 ENCOUNTER — Ambulatory Visit (INDEPENDENT_AMBULATORY_CARE_PROVIDER_SITE_OTHER): Payer: Self-pay

## 2018-11-03 ENCOUNTER — Telehealth: Payer: Self-pay | Admitting: Adult Health

## 2018-11-03 VITALS — BP 188/100 | HR 120 | Temp 98.7°F | Wt 252.1 lb

## 2018-11-03 DIAGNOSIS — F1010 Alcohol abuse, uncomplicated: Secondary | ICD-10-CM

## 2018-11-03 DIAGNOSIS — I10 Essential (primary) hypertension: Secondary | ICD-10-CM

## 2018-11-03 DIAGNOSIS — R0981 Nasal congestion: Secondary | ICD-10-CM

## 2018-11-03 DIAGNOSIS — R911 Solitary pulmonary nodule: Secondary | ICD-10-CM

## 2018-11-03 DIAGNOSIS — R042 Hemoptysis: Secondary | ICD-10-CM

## 2018-11-03 LAB — HEPATIC FUNCTION PANEL
ALBUMIN: 3 g/dL — AB (ref 3.5–5.2)
ALK PHOS: 143 U/L — AB (ref 39–117)
ALT: 109 U/L — ABNORMAL HIGH (ref 0–53)
AST: 282 U/L — AB (ref 0–37)
Bilirubin, Direct: 1.8 mg/dL — ABNORMAL HIGH (ref 0.0–0.3)
TOTAL PROTEIN: 8.2 g/dL (ref 6.0–8.3)
Total Bilirubin: 4.2 mg/dL — ABNORMAL HIGH (ref 0.2–1.2)

## 2018-11-03 LAB — BASIC METABOLIC PANEL
BUN: 7 mg/dL (ref 6–23)
CALCIUM: 8.6 mg/dL (ref 8.4–10.5)
CO2: 26 meq/L (ref 19–32)
CREATININE: 0.73 mg/dL (ref 0.40–1.50)
Chloride: 98 mEq/L (ref 96–112)
GFR: 139.83 mL/min (ref >60.00)
Glucose, Bld: 120 mg/dL — ABNORMAL HIGH (ref 70–99)
Potassium: 3.5 mEq/L (ref 3.5–5.1)
Sodium: 135 mEq/L (ref 135–145)

## 2018-11-03 LAB — CBC WITH DIFFERENTIAL/PLATELET
BASOS PCT: 2.6 % (ref 0.0–3.0)
Basophils Absolute: 0.2 10*3/uL — ABNORMAL HIGH (ref 0.0–0.1)
EOS PCT: 1.5 % (ref 0.0–5.0)
Eosinophils Absolute: 0.1 10*3/uL (ref 0.0–0.7)
HEMATOCRIT: 39.5 % (ref 39.0–52.0)
HEMOGLOBIN: 13.8 g/dL (ref 13.0–17.0)
LYMPHS PCT: 16.8 % (ref 12.0–46.0)
Lymphs Abs: 1 10*3/uL (ref 0.7–4.0)
MCHC: 35 g/dL (ref 30.0–36.0)
MCV: 93.7 fl (ref 78.0–100.0)
Monocytes Absolute: 0.8 10*3/uL (ref 0.1–1.0)
Monocytes Relative: 14 % — ABNORMAL HIGH (ref 3.0–12.0)
Neutro Abs: 3.8 10*3/uL (ref 1.4–7.7)
Neutrophils Relative %: 65.1 % (ref 43.0–77.0)
Platelets: 40 10*3/uL — CL (ref 150.0–400.0)
RBC: 4.21 Mil/uL — ABNORMAL LOW (ref 4.22–5.81)
RDW: 20.9 % — AB (ref 11.5–15.5)
WBC: 5.9 10*3/uL (ref 4.0–10.5)

## 2018-11-03 LAB — PROTIME-INR
INR: 1.6 ratio — ABNORMAL HIGH (ref 0.8–1.0)
PROTHROMBIN TIME: 18.6 s — AB (ref 9.6–13.1)

## 2018-11-03 MED ORDER — GABAPENTIN 300 MG PO CAPS: ORAL_CAPSULE | ORAL | 0 refills | Status: DC

## 2018-11-03 MED ORDER — METOPROLOL TARTRATE 25 MG PO TABS: 25.0000 mg | ORAL_TABLET | Freq: Two times a day (BID) | ORAL | 0 refills | Status: DC

## 2018-11-03 MED ORDER — DOXYCYCLINE HYCLATE 100 MG PO TABS: 100.0000 mg | ORAL_TABLET | Freq: Two times a day (BID) | ORAL | 0 refills | Status: DC

## 2018-11-03 NOTE — Patient Instructions (Addendum)
The blood could be from nasal sinus cdrainage  ? If from bp issues adding  Antibiotic  To help with bronchitis sinusitis   Use saline nose spray and add antibiotic for  possible infection    If  You are getting severe alcohol withdrawal sx need to go to hospital for treatment     We can try adding  Gabapentin  For now 300 mg 3 x per day for 2  days then 2 x per day for  2  days and then  Once a day as needed   [facility]ill refill the bp medication  Metoprolol for now since you had this before   for now and plan close fu with [facility]  Check bp readings at home  Twice a day for 5 days in a  Row and birng in monitor with  Readings to next visit   ROV  With [place] next week.     Alcohol Withdrawal Syndrome When a person who drinks a lot of alcohol stops drinking, he or she may have unpleasant and serious symptoms. These symptoms are called alcohol withdrawal syndrome. This condition may be mild or severe. It can be life-threatening. It can cause:  Shaking that you cannot control (tremor).  Sweating.  Headache.  Feeling fearful, upset, grouchy, or depressed.  Trouble sleeping (insomnia).  Nightmares.  Fast or uneven heartbeats (palpitations).  Alcohol cravings.  Feeling sick to your stomach (nausea).  Throwing up (vomiting).  Being bothered by light and sounds.  Confusion.  Trouble thinking clearly.  Not being hungry (loss of appetite).  Big changes in mood (mood swings). If you have all of the following symptoms at the same time, get help right away:  High blood pressure.  Fast heartbeat.  Trouble breathing.  Seizures.  Seeing, hearing, feeling, smelling, or tasting things that are not there (hallucinations). These symptoms are known as delirium tremens (DTs). They must be treated at the hospital right away. Follow these instructions at home:   Take over-the-counter and prescription medicines only as told by your doctor. This includes vitamins.  Do not drink  alcohol.  Do not drive until your doctor says that this is safe for you.  Have someone stay with you or be available in case you need help. This should be someone you trust. This person can help you with your symptoms. He or she can also help you to not drink.  Drink enough fluid to keep your pee (urine) pale yellow.  Think about joining a support group or a treatment program to help you stop drinking.  Keep all follow-up visits as told by your doctor. This is important. Contact a doctor if:  Your symptoms get worse.  You cannot eat or drink without throwing up.  You have a hard time not drinking alcohol.  You cannot stop drinking alcohol. Get help right away if:  You have fast or uneven heartbeats.  You have chest pain.  You have trouble breathing.  You have a seizure for the first time.  You see, hear, feel, smell, or taste something that is not there.  You get very confused. Summary  When a person who drinks a lot of alcohol stops drinking, he or she may have serious symptoms. This is called alcohol withdrawal syndrome.  Delirium tremens (DTs) is a group of life-threatening symptoms. You should get help right away if you have these symptoms.  Think about joining an alcohol support group or a treatment program. This information is not intended to  replace advice given to you by your health care provider. Make sure you discuss any questions you have with your health care provider. Document Released: 03/16/2008 Document Revised: 06/04/2017 Document Reviewed: 06/04/2017 Elsevier Interactive Patient Education  2019 [employer].

## 2018-11-03 NOTE — Telephone Encounter (Signed)
Per Kandee Keen, patient needs to go to the ED for the no platelet count.  I have tried all numbers on file several times and I have left general voicemails requesting a call back ASAP to discuss lab results. Voicemail boxes are not verified so could not leave any detailed information on voicemail.   CRM created.  Called patient to inform them of 11/03/18 lab results. When patient returns call, triage nurse may NOT disclose results as they are not resulted yet by the MD. We did however get a call report with a Critical Result.   PLEASE INFORM PATIENT TO GO TO EMERGENCY ROOM - PROVIDER IS VERY CONCERNED OF HIS LAB RESULTS AND HE NEEDS TO BE EVALUATED. PLATELET COUNT IS 40,000, VERY LOW

## 2018-11-03 NOTE — Telephone Encounter (Signed)
Pt returning call. Results were NOT released to [facility]. Advised pt to call practice at opening tomorrow AM.

## 2018-11-03 NOTE — Telephone Encounter (Signed)
Due to his symptoms and platelets being low, I would advise to go to the ER and be further evaluated

## 2018-11-03 NOTE — Telephone Encounter (Signed)
Received a call for critical labs Platelet Count: 40,000  All results are in Epic  Will send to Dr Fabian Sharp as Lorain Childes.   Sending to [facility] (PCP) to take a look as Dr Fabian Sharp is not in office this afternoon.

## 2018-11-03 NOTE — Progress Notes (Signed)
Chief Complaint  Patient presents with  . Hemoptysis    x 1-2 weeks. Pt not sure if it from PND and nosebleeds he has been having. Pt notes having increased cramping in upper body and leg pain. pt rpeorts also haivng a lot of sores in his nose resulting in frequent nose bleeds. [facility] and feeling like he is going through withdrawls. Eyes are yellowing. Muscles spasms all over body.    HPI: Barry Harris 48 y.o. come in for sda    PCP appt NA? Last seen  In office  10 2018   C/o cough and congestion ? With  Coughing up blood that he feels is from post nasal drainage   Bright red and mixed with mucous    Runs  Down back of throat a week or so . Like when fills up .  No fever face painb ut says sinus problem denies  Nasal sprays drugs etc   Hx of ed visit 2018 for palpitations ? If afib flet from   Etohwd?   Has not ben on meds but restarted the 48 yo benazepril  This week for 3 days   Not the metoprolol given in past  Some weight loss  Not sure if on purpose .   Night sweats but no fever? Weeks.  Hx of etoh abuse     But stopped   none for 2 days.  etoh pint a day .  fo vodka per report   Denies  ivdu other rd  Exposures   Onset drinking since mid teens and worse after death of father  Lives With mom nephew .   No pets.   workfork lift operatier  Night  Years ago may have tobacco  1 pack per week  For 8-9 years.    ROS: See pertinent positives and negatives per HPI. Feels jumpy  No hx of dts hallucinations   WD  Has been hosp for hydration before but no ha NVD today   Past Medical History:  Diagnosis Date  . Alcohol abuse   . Atrial fibrillation (HCC)   . Elevated liver enzymes   . Hypertension     Family History  Problem Relation Age of Onset  . Cancer Father        prostate  . Asthma Sister   . Diabetes Sister   . Diabetes Maternal Grandmother   . Heart disease Maternal Grandmother   . Diabetes Mother   . Hypertension Mother   . Breast  cancer Sister     Social History   Socioeconomic History  . Marital status: Single    Spouse name: Not on file  . Number of children: Not on file  . Years of education: Not on file  . Highest education level: Not on file  Occupational History  . Occupation: [employer] for [employer]    Employer: [employer]  Social Needs  . Financial resource strain: Not on file  . Food insecurity:    Worry: Not on file    Inability: Not on file  . Transportation needs:    Medical: Not on file    Non-medical: Not on file  Tobacco Use  . Smoking status: Former Smoker    Last attempt to quit: 04/20/2001    Years since quitting: 17.5  . Smokeless tobacco: Never Used  Substance and Sexual Activity  . Alcohol use: Yes    Alcohol/week: 3.0 standard drinks    Types: 3 Standard drinks or equivalent  per week    Comment: former drinker/ drinking problem /  . Drug use: No  . Sexual activity: Not Currently  Lifestyle  . Physical activity:    Days per week: Not on file    Minutes per session: Not on file  . Stress: Not on file  Relationships  . Social connections:    Talks on phone: Not on file    Gets together: Not on file    Attends religious service: Not on file    Active member of club or organization: Not on file    Attends meetings of clubs or organizations: Not on file    Relationship status: Not on file  Other Topics Concern  . 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       Exercise: Lifts weights some. Rides stationary bike 2-3 times per week -but does this in spurts.                    Does not consistently exercise and stay on routine.      Diet: Fairly careful with what he eats.    Outpatient Medications Prior to Visit  Medication Sig Dispense Refill  . benazepril (LOTENSIN) 40 MG tablet Take 40 mg by mouth daily.    . diazepam (VALIUM) 5 MG tablet Take 1-2 tablets every 6 hours for alcohol  withdrawal 60 tablet 0  . Multiple Vitamin (MULTIVITAMIN WITH MINERALS) TABS tablet Take 1 tablet by mouth daily.    Marland Kitchen omeprazole (PRILOSEC) 20 MG capsule Take 1 capsule (20 mg total) by mouth daily. 90 capsule 1  . chlordiazePOXIDE (LIBRIUM) 25 MG capsule Take 1-2 capsules (25-50 mg total) by mouth 4 (four) times daily as needed for anxiety. (Patient not taking: Reported on 11/03/2018) 16 capsule 0  . metoprolol tartrate (LOPRESSOR) 25 MG tablet Take 1 tablet (25 mg total) by mouth 2 (two) times daily. (Patient not taking: Reported on 11/03/2018) 60 tablet 0   No facility-administered medications prior to visit.      EXAM:  BP (!) 188/100 (BP Location: Left Arm, Patient Position: Sitting, Cuff Size: Large)   Pulse (!) 120   Temp 98.7 F (37.1 C) (Oral)   Wt 252 lb 1.6 oz (114.4 kg)   SpO2 97%   BMI 35.16 kg/m   Body mass index is 35.16 kg/m.  GENERAL: vitals reviewed and listed above, alert, oriented, appears well hydrated and in no acute distress  cohereant orientd and nl  Speech some anxiety  HEENT: atraumatic, conjunctiva  Muddy , no obvious abnormalities on inspection of external nose and ears tm clear  Crusting but no clot and sinus pain  OP : no lesion edema or exudate  NECK: no obvious masses on inspection palpation  LUNGS: clear to auscultation bilaterally, no wheezes, rales or rhonchi, good air movement CV: HRRR, no clubbing cyanosis or  peripheral edema nl cap refill  Abdomen:  Sof,t normal bowel sounds without hepatosplenomegaly, no guarding rebound or masses no CVA tenderness  MS: moves all extremities without noticeable focal  abnormality PSYCH: pleasant and cooperative,  anxiety  nuero no fasciulations somewhat jumpy  Skin: normal capillary refill ,turgor , color: No acute rashes ,petechiae or bruising   BP Readings from Last 3 Encounters:  11/03/18 (!) 188/100  07/22/17 (!) 148/90  07/20/17 139/84    ASSESSMENT AND PLAN:  Discussed the following assessment  and plan:  Hemoptysis - Plan: DG Chest 2 View, CBC with Differential/Platelet, Protime-INR, Hepatic function panel, Basic metabolic panel  Essential hypertension - Plan: DG Chest 2 View, CBC with Differential/Platelet, Protime-INR, Hepatic function panel, Basic metabolic panel  Alcohol abuse - Plan: DG Chest 2 View, CBC with Differential/Platelet, Protime-INR, Hepatic function panel, Basic metabolic panel  Incidental lung nodule - resolved on fu x ray - Plan: DG Chest 2 View, CBC with Differential/Platelet, Protime-INR, Hepatic function panel, Basic metabolic panel  Sinus congestion - Plan: DG Chest 2 View, CBC with Differential/Platelet, Protime-INR, Hepatic function panel, Basic metabolic panel  Suspect hemoptysis is ur source  Repeat x ray no nodule  No other evidence of bleeding  ie liver failure   On exam  Close fu advised  Uncertain  How much elevation of bp is from wd and or baseline    Ht.   At this time nohx of severe wd dts etc    Can try gabapentin  encouraged cessation help .  Lab today  Bring in bp monitor and readings   Restart the metoprolol for now  In inter  fortunately non focal exam today.  Currently no insurance   Needs fu with PCP for routine monitoring  She no showed may 2019 for pcp  -Patient advised to return or notify health care team  if  new concerns arise.  Patient Instructions  The blood could be from nasal sinus cdrainage  ? If from bp issues adding  Antibiotic  To help with bronchitis sinusitis   Use saline nose spray and add antibiotic for  possible infection    If  You are getting severe alcohol withdrawal sx need to go to hospital for treatment     We can try adding  Gabapentin  For now 300 mg 3 x per day for 2  days then 2 x per day for  2  days and then  Once a day as needed   Weill refill the bp medication  Metoprolol for now since you had this before   for now and plan close fu with [facility]  Check bp readings at home  Twice a day for 5 days in a   Row and birng in monitor with  Readings to next visit   ROV  With [place] next week.     Alcohol Withdrawal Syndrome When a person who drinks a lot of alcohol stops drinking, he or she may have unpleasant and serious symptoms. These symptoms are called alcohol withdrawal syndrome. This condition may be mild or severe. It can be life-threatening. It can cause:  Shaking that you cannot control (tremor).  Sweating.  Headache.  Feeling fearful, upset, grouchy, or depressed.  Trouble sleeping (insomnia).  Nightmares.  Fast or uneven heartbeats (palpitations).  Alcohol cravings.  Feeling sick to your stomach (nausea).  Throwing up (vomiting).  Being bothered by light and sounds.  Confusion.  Trouble thinking clearly.  Not being hungry (loss of appetite).  Big changes in mood (mood swings). If you have all of the following symptoms at the same time, get help right away:  High blood pressure.  Fast heartbeat.  Trouble breathing.  Seizures.  Seeing, hearing, feeling, smelling, or tasting things that are not there (hallucinations). These symptoms are known as delirium tremens (DTs). They must be treated at the hospital right away. Follow these instructions at home:   Take over-the-counter and prescription medicines only as told by your doctor. This includes vitamins.  Do not drink  alcohol.  Do not drive until your doctor says that this is safe for you.  Have someone stay with you or be available in case you need help. This should be someone you trust. This person can help you with your symptoms. He or she can also help you to not drink.  Drink enough fluid to keep your pee (urine) pale yellow.  Think about joining a support group or a treatment program to help you stop drinking.  Keep all follow-up visits as told by your doctor. This is important. Contact a doctor if:  Your symptoms get worse.  You cannot eat or drink without throwing up.  You have a hard  time not drinking alcohol.  You cannot stop drinking alcohol. Get help right away if:  You have fast or uneven heartbeats.  You have chest pain.  You have trouble breathing.  You have a seizure for the first time.  You see, hear, feel, smell, or taste something that is not there.  You get very confused. Summary  When a person who drinks a lot of alcohol stops drinking, he or she may have serious symptoms. This is called alcohol withdrawal syndrome.  Delirium tremens (DTs) is a group of life-threatening symptoms. You should get help right away if you have these symptoms.  Think about joining an alcohol support group or a treatment program. This information is not intended to replace advice given to you by your health care provider. Make sure you discuss any questions you have with your health care provider. Document Released: 03/16/2008 Document Revised: 06/04/2017 Document Reviewed: 06/04/2017 Elsevier Interactive Patient Education  2019 [employer]       [place] NewsWanda K.  M.D.

## 2018-11-04 ENCOUNTER — Emergency Department (HOSPITAL_COMMUNITY): Payer: Self-pay

## 2018-11-04 ENCOUNTER — Encounter (HOSPITAL_COMMUNITY): Payer: Self-pay | Admitting: Emergency Medicine

## 2018-11-04 ENCOUNTER — Emergency Department (HOSPITAL_COMMUNITY)
Admission: EM | Admit: 2018-11-04 | Discharge: 2018-11-04 | Disposition: A | Discharge status: Home / Self Care | Payer: Self-pay | Attending: Emergency Medicine | Admitting: Emergency Medicine

## 2018-11-04 DIAGNOSIS — Z87891 Personal history of nicotine dependence: Secondary | ICD-10-CM | POA: Insufficient documentation

## 2018-11-04 DIAGNOSIS — D696 Thrombocytopenia, unspecified: Secondary | ICD-10-CM | POA: Insufficient documentation

## 2018-11-04 DIAGNOSIS — F411 Generalized anxiety disorder: Secondary | ICD-10-CM | POA: Insufficient documentation

## 2018-11-04 DIAGNOSIS — Z79899 Other long term (current) drug therapy: Secondary | ICD-10-CM | POA: Insufficient documentation

## 2018-11-04 DIAGNOSIS — R748 Abnormal levels of other serum enzymes: Secondary | ICD-10-CM

## 2018-11-04 DIAGNOSIS — I10 Essential (primary) hypertension: Secondary | ICD-10-CM | POA: Insufficient documentation

## 2018-11-04 DIAGNOSIS — F1010 Alcohol abuse, uncomplicated: Secondary | ICD-10-CM | POA: Insufficient documentation

## 2018-11-04 LAB — CBC WITH DIFFERENTIAL/PLATELET
ABS IMMATURE GRANULOCYTES: 0.02 10*3/uL (ref 0.00–0.07)
BASOS ABS: 0.1 10*3/uL (ref 0.0–0.1)
Basophils Relative: 2 %
EOS PCT: 2 %
Eosinophils Absolute: 0.1 10*3/uL (ref 0.0–0.5)
HEMATOCRIT: 36.3 % — AB (ref 39.0–52.0)
Hemoglobin: 13.7 g/dL (ref 13.0–17.0)
Immature Granulocytes: 0 %
LYMPHS ABS: 1.3 10*3/uL (ref 0.7–4.0)
LYMPHS PCT: 23 %
MCH: 32.2 pg (ref 26.0–34.0)
MCHC: 37.7 g/dL — ABNORMAL HIGH (ref 30.0–36.0)
MCV: 85.2 fL (ref 80.0–100.0)
MONO ABS: 0.6 10*3/uL (ref 0.1–1.0)
Monocytes Relative: 11 %
NEUTROS ABS: 3.4 10*3/uL (ref 1.7–7.7)
Neutrophils Relative %: 62 %
PLATELETS: 41 10*3/uL — AB (ref 150–400)
RBC: 4.26 MIL/uL (ref 4.22–5.81)
RDW: 21.2 % — AB (ref 11.5–15.5)
WBC: 5.6 10*3/uL (ref 4.0–10.5)
nRBC: 0 % (ref 0.0–0.2)

## 2018-11-04 LAB — COMPREHENSIVE METABOLIC PANEL
ALBUMIN: 3.1 g/dL — AB (ref 3.5–5.0)
ALT: 111 U/L — AB (ref 0–44)
AST: 290 U/L — AB (ref 15–41)
Alkaline Phosphatase: 128 U/L — ABNORMAL HIGH (ref 38–126)
Anion gap: 11 (ref 5–15)
BUN: 8 mg/dL (ref 6–20)
CHLORIDE: 101 mmol/L (ref 98–111)
CO2: 22 mmol/L (ref 22–32)
CREATININE: 0.77 mg/dL (ref 0.61–1.24)
Calcium: 8.3 mg/dL — ABNORMAL LOW (ref 8.9–10.3)
GFR calc Af Amer: >60 mL/min (ref >60)
GLUCOSE: 101 mg/dL — AB (ref 70–99)
POTASSIUM: 3.7 mmol/L (ref 3.5–5.1)
Sodium: 134 mmol/L — ABNORMAL LOW (ref 135–145)
Total Bilirubin: 5.2 mg/dL — ABNORMAL HIGH (ref 0.3–1.2)
Total Protein: 8.8 g/dL — ABNORMAL HIGH (ref 6.5–8.1)

## 2018-11-04 LAB — PROTIME-INR
INR: 1.58
Prothrombin Time: 18.7 seconds — ABNORMAL HIGH (ref 11.4–15.2)

## 2018-11-04 MED ORDER — CHLORDIAZEPOXIDE HCL 25 MG PO CAPS: ORAL_CAPSULE | ORAL | 0 refills | Status: DC

## 2018-11-04 NOTE — ED Triage Notes (Signed)
Pt reports was seen at PCP yesterday because hasnt been feeling well, having lots of nose bleeds and "eyes dont look to good". Was called this morning and told to go to ED.  Pt thinks liver enzymes high. In blood work from yesterday they are high and PLT 40.

## 2018-11-04 NOTE — ED Notes (Signed)
RN discussed with pt if he would be interested in Peer support. Pt reports he would be open to talking to someone. Pt reports his last alcoholic drink was x2 days ago. Pt reports he woke up and realized his eyes were yellow and he needed to get checked out. RN placed standing order for Peer support.

## 2018-11-04 NOTE — ED Provider Notes (Signed)
[facility]-EMERGENCY DEPT Provider Note   CSN: [id number] Arrival date & time: 11/04/18  0908     History   Chief Complaint Chief Complaint  Patient presents with  . Abnormal Lab    HPI Barry Harris is a 48 y.o. male.  The history is provided by the patient.  Illness  Location:  General Severity:  Mild Onset quality:  Gradual Progression:  Unchanged Chronicity:  Recurrent Context:  Patient here due to low platelets and elevated INR that was found at primary care doctor's office yesterday.  Patient with no active bleeding.  Continues to abuse alcohol daily. Relieved by:  Nothing Worsened by:  Nothing Associated symptoms: no abdominal pain, no chest pain, no cough, no ear pain, no fatigue, no fever, no rash, no shortness of breath, no sore throat and no vomiting     Past Medical History:  Diagnosis Date  . Alcohol abuse   . Atrial fibrillation (HCC)   . Elevated liver enzymes   . Hypertension     Patient Active Problem List   Diagnosis Date Noted  . Hypertriglyceridemia 12/04/2014  . Palpitations 11/30/2014  . Elevated transaminase level 11/30/2014  . Alcohol abuse 11/30/2014  . Hyponatremia 11/30/2014  . Transaminitis   . GAD (generalized anxiety disorder) 06/12/2014  . Obesity 05/08/2014  . Hypertension     Past Surgical History:  Procedure Laterality Date  . SPINE SURGERY     48yo        Home Medications    Prior to Admission medications   Medication Sig Start Date End Date Taking? Authorizing Provider  doxycycline (VIBRA-TABS) 100 MG tablet Take 1 tablet (100 mg total) by mouth 2 (two) times daily. Patient taking differently: Take 100 mg by mouth 2 (two) times daily. Take 100mg  by mouth twice daily for seven days. Course of therapy began 11-03-18. Pt taking as prescribed. 11/03/18  Yes Panosh, Neta Mends, MD  gabapentin (NEURONTIN) 300 MG capsule 1 po tid for 2  days then 1 po bid for   2  days then once per day Patient  taking differently: Take 300-600 mg by mouth See admin instructions. 1 po tid for 2  days then 1 po bid for   2  days then once per day  Pt had 600mg  11/03/18  Yes Panosh, Neta Mends, MD  metoprolol tartrate (LOPRESSOR) 25 MG tablet Take 1 tablet (25 mg total) by mouth 2 (two) times daily. 11/03/18  Yes Panosh, Neta Mends, MD  Multiple Vitamin (MULTIVITAMIN WITH MINERALS) TABS tablet Take 1 tablet by mouth daily.   Yes [provider]  chlordiazePOXIDE (LIBRIUM) 25 MG capsule 50mg  PO TID x 1D, then 25 mg PO BID X 1D, then 25 mg PO QD X 1D 11/04/18   Elius Etheredge, DO  diazepam (VALIUM) 5 MG tablet Take 1-2 tablets every 6 hours for alcohol withdrawal 07/19/17   Shirline Frees, NP    Family History Family History  Problem Relation Age of Onset  . Cancer Father        prostate  . Asthma Sister   . Diabetes Sister   . Diabetes Maternal Grandmother   . Heart disease Maternal Grandmother   . Diabetes Mother   . Hypertension Mother   . Breast cancer Sister     Social History Social History   Tobacco Use  . Smoking status: Former Smoker    Last attempt to quit: 04/20/2001    Years since quitting: 17.5  . Smokeless tobacco:  Never Used  Substance Use Topics  . Alcohol use: Yes    Alcohol/week: 3.0 standard drinks    Types: 3 Standard drinks or equivalent per week    Comment: former drinker/ drinking problem /  . Drug use: No     Allergies   Patient has no known allergies.   Review of Systems Review of Systems  Constitutional: Negative for chills, fatigue and fever.  HENT: Negative for ear pain and sore throat.   Eyes: Negative for pain and visual disturbance.  Respiratory: Negative for cough and shortness of breath.   Cardiovascular: Negative for chest pain and palpitations.  Gastrointestinal: Negative for abdominal pain and vomiting.  Genitourinary: Negative for dysuria and hematuria.  Musculoskeletal: Negative for arthralgias and back pain.  Skin: Negative for color  change and rash.  Neurological: Negative for seizures and syncope.  All other systems reviewed and are negative.    Physical Exam Updated Vital Signs  ED Triage Vitals  Enc Vitals Group     BP 11/04/18 0922 (!) 176/107     Pulse Rate 11/04/18 0922 86     Resp 11/04/18 0922 20     Temp 11/04/18 0922 98.7 F (37.1 C)     Temp Source 11/04/18 0922 Oral     SpO2 11/04/18 0922 100 %     Weight 11/04/18 0923 252 lb (114.3 kg)     Height 11/04/18 0923 5\' 11"  (1.803 m)     Head Circumference --      Peak Flow --      Pain Score 11/04/18 0919 0     Pain Loc --      Pain Edu? --      Excl. in GC? --     Physical Exam Vitals signs and nursing note reviewed.  Constitutional:      Appearance: He is well-developed.  HENT:     Head: Normocephalic and atraumatic.     Nose: Nose normal.     Mouth/Throat:     Mouth: Mucous membranes are moist.  Eyes:     Extraocular Movements: Extraocular movements intact.     Conjunctiva/sclera: Conjunctivae normal.     Pupils: Pupils are equal, round, and reactive to light.  Neck:     Musculoskeletal: Neck supple.  Cardiovascular:     Rate and Rhythm: Normal rate and regular rhythm.     Pulses: Normal pulses.     Heart sounds: Normal heart sounds. No murmur.  Pulmonary:     Effort: Pulmonary effort is normal. No respiratory distress.     Breath sounds: Normal breath sounds.  Abdominal:     Palpations: Abdomen is soft.     Tenderness: There is no abdominal tenderness.  Skin:    General: Skin is warm and dry.     Capillary Refill: Capillary refill takes less than 2 seconds.  Neurological:     General: No focal deficit present.     Mental Status: He is alert.  Psychiatric:        Mood and Affect: Mood normal.      ED Treatments / Results  Labs (all labs ordered are listed, but only abnormal results are displayed) Labs Reviewed  CBC WITH DIFFERENTIAL/PLATELET - Abnormal; Notable for the following components:      Result Value   HCT  36.3 (*)    MCHC 37.7 (*)    RDW 21.2 (*)    Platelets 41 (*)    All other components within normal limits  COMPREHENSIVE  METABOLIC PANEL - Abnormal; Notable for the following components:   Sodium 134 (*)    Glucose, Bld 101 (*)    Calcium 8.3 (*)    Total Protein 8.8 (*)    Albumin 3.1 (*)    AST 290 (*)    ALT 111 (*)    Alkaline Phosphatase 128 (*)    Total Bilirubin 5.2 (*)    All other components within normal limits  PROTIME-INR - Abnormal; Notable for the following components:   Prothrombin Time 18.7 (*)    All other components within normal limits    EKG None  Radiology Dg Chest 2 View  Result Date: 11/04/2018 CLINICAL DATA:  Hypertension.  Several episodes of epistaxis EXAM: CHEST - 2 VIEW COMPARISON:  November 03, 2018 FINDINGS: There is no evident edema or consolidation. The heart size and pulmonary vascularity are normal. No adenopathy. No bone lesions. IMPRESSION: No edema or consolidation. Electronically Signed   By: Bretta BangWilliam  Woodruff III M.D.   On: 11/04/2018 09:58   Dg Chest 2 View  Result Date: 11/03/2018 CLINICAL DATA:  Hemoptysis. Questionable nodular densities in the right upper lobe on recent chest radiographs. EXAM: CHEST - 2 VIEW COMPARISON:  07/20/2017. FINDINGS: Normal sized heart. Clear lungs. The previously see suspected nodular densities on the right are lung longer seen. Mild diffuse peribronchial thickening is again demonstrated. Thoracic spine degenerative changes. IMPRESSION: 1. No right lung nodule seen today. 2. Stable mild chronic bronchitic changes. Electronically Signed   By: Beckie SaltsSteven  Reid M.D.   On: 11/03/2018 10:47    Procedures Procedures (including critical care time)  Medications Ordered in ED Medications - No data to display   Initial Impression / Assessment and Plan / ED Course  I have reviewed the triage vital signs and the nursing notes.  Pertinent labs & imaging results that were available during my care of the patient were  reviewed by me and considered in my medical decision making (see chart for details).     Alfredo MartinezShaunte L Denbleyker is a 48 year old male with history of alcohol abuse, elevated liver enzymes who presents to the ED after having abnormal labs found at his clinic yesterday.  Patient overall asymptomatic.  Was found to be with low platelets, elevated INR to 1.68.  Patient continues to abuse alcohol.  Has seen GI several years ago for the same and was found to have elevated liver enzymes secondary to alcohol use.  He continues to drink daily.  He denies any bleeding.  No melena, no hematochezia.  Had some nosebleeds the other day but has now resolved.  He is overall well-appearing.  No ascites.  No tenderness to his abdomen.  Repeat lab work showed stable platelets, stable INR.  Liver enzymes and biliary enzymes normal.  Patient was counseled on alcohol use.  Was given Librium prescription.  He states that he wants to make changes to quit.  I discussed the case with gastroenterology on call and they will follow-up outpatient. Labs stable, no acute liver failure. He was given resources for rehab.  Given return precautions and discharged from ED in good condition.  This chart was dictated using voice recognition software.  Despite best efforts to proofread,  errors can occur which can change the documentation meaning.   Final Clinical Impressions(s) / ED Diagnoses   Final diagnoses:  Thrombocytopenia (HCC)  Elevated liver enzymes    ED Discharge Orders         Ordered    chlordiazePOXIDE (LIBRIUM) 25 MG  capsule     11/04/18 1210           Virgina Norfolk, DO 11/04/18 1253

## 2018-11-04 NOTE — Telephone Encounter (Signed)
Pt returned call and message given to him to go to the hospital to be evaluated. [facility]. Labs were not disclosed to him.

## 2018-11-04 NOTE — Patient Outreach (Signed)
ED Peer Support Specialist Patient Intake (Complete at intake & 30-60 Day Follow-up)  Name: Barry Harris  MRN: [id number]  Age: 48 y.o.   Date of Admission: 11/04/2018  Intake: Initial Comments:      Primary Reason Admitted: Alcohol abuse  Lab values: Alcohol/ETOH: Positive Positive UDS? No Amphetamines: No Barbiturates: No Benzodiazepines: No Cocaine: No Opiates: No Cannabinoids: No  Demographic information: Gender: Male Ethnicity: African American Marital Status: Single Insurance Status: Uninsured/Self-pay Ecologist (Work Neurosurgeon, Physicist, medical, etc.: No Lives with: Partner/Spouse Living situation: House/Apartment  Reported Patient History: Patient reported health conditions: None Patient aware of HIV and hepatitis status: No  In past year, has patient visited ED for any reason? No  Number of ED visits:    Reason(s) for visit:    In past year, has patient been hospitalized for any reason? No  Number of hospitalizations:    Reason(s) for hospitalization:    In past year, has patient been arrested? No  Number of arrests:    Reason(s) for arrest:    In past year, has patient been incarcerated? No  Number of incarcerations:    Reason(s) for incarceration:    In past year, has patient received medication-assisted treatment? No  In past year, patient received the following treatments: Other (comment)  In past year, has patient received any harm reduction services? No  Did this include any of the following?    In past year, has patient received care from a mental health provider for diagnosis other than SUD? No  In past year, is this first time patient has overdosed? No  Number of past overdoses:    In past year, is this first time patient has been hospitalized for an overdose? No  Number of hospitalizations for overdose(s):    Is patient currently receiving treatment for a mental health diagnosis? No  Patient  reports experiencing difficulty participating in SUD treatment: No    Most important reason(s) for this difficulty?    Has patient received prior services for treatment? No  In past, patient has received services from following agencies:    Plan of Care:  Suggested follow up at these agencies/treatment centers: [facility])  Other information: CPSS met with Pt and was able to gain information to better assist Pt. CPSS was made aware that he is having Liver Problems because of his drinking and he wants to seek help. CPSS discussed the importance of Pt considering a treatment facility at this time. Pt addressed the fact that he works and that he cannot miss work at this time. CPSS gave Pt AA information and [facility] information for him to contact a outpatient facility and will be able to work with him while working.    [name], [place]  11/04/2018 12:34 PM

## 2018-11-04 NOTE — Discharge Instructions (Signed)
To help you maintain a sober lifestyle, a substance abuse treatment program may be beneficial to you.  Contact [facility] at your earliest opportunity to ask about enrolling in their program: ° °     [facility] °     [address] °     [place], [place] [address] °     [phone] °     New patients are seen at the walk-in clinic every Tuesday from 9:00 am - 12:00 pm °

## 2018-11-08 NOTE — Progress Notes (Signed)
Pt has appt with PCP on 11/10/2018

## 2018-11-09 ENCOUNTER — Ambulatory Visit: Payer: Self-pay | Admitting: Physician Assistant

## 2018-11-10 ENCOUNTER — Other Ambulatory Visit: Payer: Self-pay

## 2018-11-10 ENCOUNTER — Ambulatory Visit: Payer: Self-pay | Admitting: Adult Health

## 2018-11-10 DIAGNOSIS — Z0289 Encounter for other administrative examinations: Secondary | ICD-10-CM

## 2018-11-10 DIAGNOSIS — R899 Unspecified abnormal finding in specimens from other organs, systems and tissues: Secondary | ICD-10-CM

## 2018-11-18 ENCOUNTER — Ambulatory Visit: Payer: Self-pay | Admitting: Physician Assistant

## 2018-11-25 ENCOUNTER — Other Ambulatory Visit: Payer: Self-pay | Admitting: Internal Medicine

## 2018-12-13 ENCOUNTER — Ambulatory Visit (INDEPENDENT_AMBULATORY_CARE_PROVIDER_SITE_OTHER): Payer: Self-pay | Admitting: Adult Health

## 2018-12-13 ENCOUNTER — Encounter: Payer: Self-pay | Admitting: Adult Health

## 2018-12-13 VITALS — BP 140/100 | Temp 98.3°F | Wt 246.0 lb

## 2018-12-13 DIAGNOSIS — F1010 Alcohol abuse, uncomplicated: Secondary | ICD-10-CM

## 2018-12-13 LAB — CBC WITH DIFFERENTIAL/PLATELET
Basophils Absolute: 0.1 10*3/uL (ref 0.0–0.1)
Basophils Relative: 2.3 % (ref 0.0–3.0)
Eosinophils Absolute: 0.2 10*3/uL (ref 0.0–0.7)
Eosinophils Relative: 3.6 % (ref 0.0–5.0)
HCT: 40.2 % (ref 39.0–52.0)
Hemoglobin: 14.4 g/dL (ref 13.0–17.0)
Lymphocytes Relative: 24.5 % (ref 12.0–46.0)
Lymphs Abs: 1.3 10*3/uL (ref 0.7–4.0)
MCHC: 35.9 g/dL (ref 30.0–36.0)
MCV: 95.8 fl (ref 78.0–100.0)
Monocytes Absolute: 0.9 10*3/uL (ref 0.1–1.0)
Monocytes Relative: 16.7 % — ABNORMAL HIGH (ref 3.0–12.0)
Neutro Abs: 2.9 10*3/uL (ref 1.4–7.7)
Neutrophils Relative %: 52.9 % (ref 43.0–77.0)
Platelets: 29 10*3/uL — CL (ref 150.0–400.0)
RBC: 4.19 Mil/uL — ABNORMAL LOW (ref 4.22–5.81)
RDW: 16.8 % — ABNORMAL HIGH (ref 11.5–15.5)
WBC: 5.4 10*3/uL (ref 4.0–10.5)

## 2018-12-13 LAB — COMPREHENSIVE METABOLIC PANEL
ALT: 65 U/L — ABNORMAL HIGH (ref 0–53)
AST: 154 U/L — AB (ref 0–37)
Albumin: 3.1 g/dL — ABNORMAL LOW (ref 3.5–5.2)
Alkaline Phosphatase: 122 U/L — ABNORMAL HIGH (ref 39–117)
BUN: 11 mg/dL (ref 6–23)
CO2: 24 mEq/L (ref 19–32)
CREATININE: 0.88 mg/dL (ref 0.40–1.50)
Calcium: 8.4 mg/dL (ref 8.4–10.5)
Chloride: 97 mEq/L (ref 96–112)
GFR: 112.65 mL/min (ref >60.00)
Glucose, Bld: 92 mg/dL (ref 70–99)
Potassium: 3.8 mEq/L (ref 3.5–5.1)
SODIUM: 132 meq/L — AB (ref 135–145)
Total Bilirubin: 3.4 mg/dL — ABNORMAL HIGH (ref 0.2–1.2)
Total Protein: 7.8 g/dL (ref 6.0–8.3)

## 2018-12-13 LAB — PROTIME-INR
INR: 1.7 ratio — ABNORMAL HIGH (ref 0.8–1.0)
Prothrombin Time: 19.4 s — ABNORMAL HIGH (ref 9.6–13.1)

## 2018-12-13 MED ORDER — ACAMPROSATE CALCIUM 333 MG PO TBEC: 666.0000 mg | DELAYED_RELEASE_TABLET | Freq: Three times a day (TID) | ORAL | 2 refills | Status: AC

## 2018-12-13 NOTE — Progress Notes (Signed)
Subjective:    Patient ID: Alfredo Martinez, male    DOB: [birth year], 48 y.o.   MRN: [id number]  HPI 48 year old male who  has a past medical history of Alcohol abuse, Atrial fibrillation (HCC), Elevated liver enzymes, and Hypertension.   Presents to the office today for follow-up after emergency room visit on 11/04/2018.  Sent to the emergency room after labs in the office showed low platelets and elevated INR.  He denied any active bleeding.  He reports today that he has been sober since his ER visit in January. He is going to AA three times a week and has been going to the gym and eating healthy. He finds this helpful.   He continues to have cravings, insomnia, and vivid dreams.   He has bought a bottle of liquor but when he got home he pored it down the sink   INR - 1.6  Platelets- 40   He denies chest pain. Shortness of breath, chest pain, or bleeding.  Lab Results  Component Value Date   ALT 111 (H) 11/04/2018   AST 290 (H) 11/04/2018   ALKPHOS 128 (H) 11/04/2018   BILITOT 5.2 (H) 11/04/2018   Wt Readings from Last 3 Encounters:  12/13/18 246 lb (111.6 kg)  11/04/18 252 lb (114.3 kg)  11/03/18 252 lb 1.6 oz (114.4 kg)    Review of Systems  All other systems reviewed and are negative.  See HPI   Past Medical History:  Diagnosis Date  . Alcohol abuse   . Atrial fibrillation (HCC)   . Elevated liver enzymes   . Hypertension     Social History   Socioeconomic History  . Marital status: Single    Spouse name: Not on file  . Number of children: Not on file  . Years of education: Not on file  . Highest education level: Not on file  Occupational History  . Occupation: [employer] for [employer]    Employer: [employer]  Social Needs  . Financial resource strain: Not on file  . Food insecurity:    Worry: Not on file    Inability: Not on file  . Transportation needs:    Medical: Not on file    Non-medical: Not on file  Tobacco Use  . Smoking status:  Former Smoker    Last attempt to quit: 04/20/2001    Years since quitting: 17.6  . Smokeless tobacco: Never Used  Substance and Sexual Activity  . Alcohol use: Yes    Alcohol/week: 3.0 standard drinks    Types: 3 Standard drinks or equivalent per week    Comment: former drinker/ drinking problem /  . Drug use: No  . Sexual activity: Not Currently  Lifestyle  . Physical activity:    Days per week: Not on file    Minutes per session: Not on file  . Stress: Not on file  Relationships  . Social connections:    Talks on phone: Not on file    Gets together: Not on file    Attends religious service: Not on file    Active member of club or organization: Not on file    Attends meetings of clubs or organizations: Not on file    Relationship status: Not on file  . Intimate partner violence:    Fear of current or ex partner: Not on file    Emotionally abused: Not on file    Physically abused: Not on file    Forced sexual activity: Not on  file  Other Topics Concern  . 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       Exercise: Lifts weights some. Rides stationary bike 2-3 times per week -but does this in spurts.                    Does not consistently exercise and stay on routine.      Diet: Fairly careful with what he eats.    Past Surgical History:  Procedure Laterality Date  . SPINE SURGERY     48yo    Family History  Problem Relation Age of Onset  . Cancer Father        prostate  . Asthma Sister   . Diabetes Sister   . Diabetes Maternal Grandmother   . Heart disease Maternal Grandmother   . Diabetes Mother   . Hypertension Mother   . Breast cancer Sister     No Known Allergies  Current Outpatient Medications on File Prior to Visit  Medication Sig Dispense Refill  . metoprolol tartrate (LOPRESSOR) 25 MG tablet TAKE 1 TABLET BY MOUTH TWICE A DAY 60 tablet 0  . Multiple Vitamin  (MULTIVITAMIN WITH MINERALS) TABS tablet Take 1 tablet by mouth daily.     No current facility-administered medications on file prior to visit.     BP (!) 140/100   Temp 98.3 F (36.8 C)   Wt 246 lb (111.6 kg)   BMI 34.31 kg/m       Objective:   Physical Exam Vitals signs and nursing note reviewed.  Constitutional:      Appearance: Normal appearance.  Cardiovascular:     Rate and Rhythm: Normal rate and regular rhythm.     Pulses: Normal pulses.     Heart sounds: Normal heart sounds.  Pulmonary:     Effort: Pulmonary effort is normal.     Breath sounds: Normal breath sounds.  Abdominal:     General: Abdomen is flat. Bowel sounds are normal.     Palpations: Abdomen is soft.  Skin:    General: Skin is warm and dry.     Capillary Refill: Capillary refill takes less than 2 seconds.  Neurological:     General: No focal deficit present.     Mental Status: He is alert and oriented to person, place, and time.        Assessment & Plan:  1. Alcohol abuse - acamprosate (CAMPRAL) 333 MG tablet; Take 2 tablets (666 mg total) by mouth 3 (three) times daily with meals for 30 days.  Dispense: 180 tablet; Refill: 2 - CBC with Differential/Platelet - Protime-INR - CMP - Follow up in 90 days. Consider [place] of liver at this time  - Congratulated on stay sober.   [employer]

## 2018-12-15 ENCOUNTER — Telehealth: Payer: Self-pay | Admitting: Adult Health

## 2018-12-15 NOTE — Telephone Encounter (Signed)
Pt called to get results.  Copied from CRM [id number]. Topic: Quick Communication - Lab Results (Clinic Use ONLY) >> Dec 14, 2018  4:54 PM Raj Janus T, CMA wrote: Called patient to inform them of all lab results from 12/13/2018. When patient returns call, triage nurse may disclose results.

## 2018-12-15 NOTE — Telephone Encounter (Signed)
Patient notified

## 2018-12-16 ENCOUNTER — Other Ambulatory Visit: Payer: Self-pay

## 2018-12-19 ENCOUNTER — Other Ambulatory Visit: Payer: Self-pay | Admitting: Internal Medicine

## 2018-12-19 ENCOUNTER — Other Ambulatory Visit: Payer: Self-pay

## 2018-12-19 NOTE — Telephone Encounter (Signed)
Cory pt 

## 2018-12-20 NOTE — Telephone Encounter (Signed)
Barry Keen, do you need to see pt for cpx?  If so, when?

## 2018-12-21 NOTE — Telephone Encounter (Signed)
Sent to the pharmacy by e-scribe. 

## 2018-12-21 NOTE — Telephone Encounter (Signed)
Ok to refill for 90 days  

## 2019-02-08 ENCOUNTER — Other Ambulatory Visit: Payer: Self-pay | Admitting: Adult Health

## 2019-02-08 NOTE — Telephone Encounter (Signed)
DENIED.  FILLED ON 12/21/2018 FOR 90 DAYS.

## 2019-02-13 ENCOUNTER — Other Ambulatory Visit: Payer: Self-pay

## 2019-02-13 ENCOUNTER — Encounter (HOSPITAL_COMMUNITY): Payer: Self-pay | Admitting: Emergency Medicine

## 2019-02-13 ENCOUNTER — Inpatient Hospital Stay (HOSPITAL_COMMUNITY)
Admission: EM | Admit: 2019-02-13 | Discharge: 2019-02-16 | DRG: 433 | Disposition: A | Discharge status: Home / Self Care | Payer: Self-pay | Attending: Internal Medicine | Admitting: Internal Medicine

## 2019-02-13 DIAGNOSIS — E8729 Other acidosis: Secondary | ICD-10-CM | POA: Diagnosis present

## 2019-02-13 DIAGNOSIS — Z8249 Family history of ischemic heart disease and other diseases of the circulatory system: Secondary | ICD-10-CM

## 2019-02-13 DIAGNOSIS — D696 Thrombocytopenia, unspecified: Secondary | ICD-10-CM | POA: Diagnosis present

## 2019-02-13 DIAGNOSIS — I8510 Secondary esophageal varices without bleeding: Secondary | ICD-10-CM

## 2019-02-13 DIAGNOSIS — E785 Hyperlipidemia, unspecified: Secondary | ICD-10-CM | POA: Diagnosis present

## 2019-02-13 DIAGNOSIS — R748 Abnormal levels of other serum enzymes: Secondary | ICD-10-CM

## 2019-02-13 DIAGNOSIS — K7011 Alcoholic hepatitis with ascites: Secondary | ICD-10-CM | POA: Diagnosis present

## 2019-02-13 DIAGNOSIS — K7031 Alcoholic cirrhosis of liver with ascites: Principal | ICD-10-CM | POA: Diagnosis present

## 2019-02-13 DIAGNOSIS — F10231 Alcohol dependence with withdrawal delirium: Secondary | ICD-10-CM | POA: Diagnosis present

## 2019-02-13 DIAGNOSIS — Z87891 Personal history of nicotine dependence: Secondary | ICD-10-CM

## 2019-02-13 DIAGNOSIS — Z79899 Other long term (current) drug therapy: Secondary | ICD-10-CM

## 2019-02-13 DIAGNOSIS — Z833 Family history of diabetes mellitus: Secondary | ICD-10-CM

## 2019-02-13 DIAGNOSIS — F10939 Alcohol use, unspecified with withdrawal, unspecified: Secondary | ICD-10-CM | POA: Diagnosis present

## 2019-02-13 DIAGNOSIS — D689 Coagulation defect, unspecified: Secondary | ICD-10-CM | POA: Diagnosis present

## 2019-02-13 DIAGNOSIS — E872 Acidosis: Secondary | ICD-10-CM | POA: Diagnosis present

## 2019-02-13 DIAGNOSIS — K921 Melena: Secondary | ICD-10-CM | POA: Diagnosis present

## 2019-02-13 DIAGNOSIS — I4891 Unspecified atrial fibrillation: Secondary | ICD-10-CM | POA: Diagnosis present

## 2019-02-13 DIAGNOSIS — I10 Essential (primary) hypertension: Secondary | ICD-10-CM | POA: Diagnosis present

## 2019-02-13 DIAGNOSIS — Z803 Family history of malignant neoplasm of breast: Secondary | ICD-10-CM

## 2019-02-13 DIAGNOSIS — F10239 Alcohol dependence with withdrawal, unspecified: Secondary | ICD-10-CM

## 2019-02-13 DIAGNOSIS — Z825 Family history of asthma and other chronic lower respiratory diseases: Secondary | ICD-10-CM

## 2019-02-13 DIAGNOSIS — R17 Unspecified jaundice: Secondary | ICD-10-CM

## 2019-02-13 DIAGNOSIS — R9431 Abnormal electrocardiogram [ECG] [EKG]: Secondary | ICD-10-CM | POA: Diagnosis present

## 2019-02-13 DIAGNOSIS — K766 Portal hypertension: Secondary | ICD-10-CM | POA: Diagnosis present

## 2019-02-13 DIAGNOSIS — R188 Other ascites: Secondary | ICD-10-CM | POA: Diagnosis present

## 2019-02-13 DIAGNOSIS — K3189 Other diseases of stomach and duodenum: Secondary | ICD-10-CM

## 2019-02-13 LAB — COMPREHENSIVE METABOLIC PANEL
ALT: 152 U/L — ABNORMAL HIGH (ref 0–44)
AST: 540 U/L — ABNORMAL HIGH (ref 15–41)
Albumin: 2.9 g/dL — ABNORMAL LOW (ref 3.5–5.0)
Alkaline Phosphatase: 159 U/L — ABNORMAL HIGH (ref 38–126)
Anion gap: 22 — ABNORMAL HIGH (ref 5–15)
BUN: 11 mg/dL (ref 6–20)
CO2: 18 mmol/L — ABNORMAL LOW (ref 22–32)
Calcium: 8.5 mg/dL — ABNORMAL LOW (ref 8.9–10.3)
Chloride: 97 mmol/L — ABNORMAL LOW (ref 98–111)
Creatinine, Ser: 1.01 mg/dL (ref 0.61–1.24)
GFR calc Af Amer: >60 mL/min (ref >60)
GFR calc non Af Amer: >60 mL/min (ref >60)
Glucose, Bld: 93 mg/dL (ref 70–99)
Potassium: 3.5 mmol/L (ref 3.5–5.1)
Sodium: 137 mmol/L (ref 135–145)
Total Bilirubin: 8.4 mg/dL — ABNORMAL HIGH (ref 0.3–1.2)
Total Protein: 9 g/dL — ABNORMAL HIGH (ref 6.5–8.1)

## 2019-02-13 LAB — LIPASE, BLOOD: Lipase: 59 U/L — ABNORMAL HIGH (ref 11–51)

## 2019-02-13 MED ORDER — SODIUM CHLORIDE 0.9% FLUSH: 3.0000 mL | Freq: Once | INTRAVENOUS | Status: DC

## 2019-02-13 NOTE — ED Notes (Signed)
Pt could not void sat this time

## 2019-02-13 NOTE — ED Triage Notes (Signed)
Pt reports intermittent generalized abdominal pain and some mild SOB x 2 days,  Reports he stopped drinking 3 days ago and thinks it may be related.

## 2019-02-13 NOTE — ED Notes (Signed)
Pt's mother Manik Westgate [phone]. Pls contact with Updates

## 2019-02-14 ENCOUNTER — Observation Stay (HOSPITAL_COMMUNITY): Payer: Self-pay

## 2019-02-14 DIAGNOSIS — R17 Unspecified jaundice: Secondary | ICD-10-CM

## 2019-02-14 DIAGNOSIS — R748 Abnormal levels of other serum enzymes: Secondary | ICD-10-CM

## 2019-02-14 DIAGNOSIS — F10939 Alcohol use, unspecified with withdrawal, unspecified: Secondary | ICD-10-CM | POA: Diagnosis present

## 2019-02-14 DIAGNOSIS — F10239 Alcohol dependence with withdrawal, unspecified: Secondary | ICD-10-CM | POA: Diagnosis present

## 2019-02-14 DIAGNOSIS — R9431 Abnormal electrocardiogram [ECG] [EKG]: Secondary | ICD-10-CM | POA: Diagnosis present

## 2019-02-14 DIAGNOSIS — E872 Acidosis: Secondary | ICD-10-CM | POA: Diagnosis present

## 2019-02-14 DIAGNOSIS — K7031 Alcoholic cirrhosis of liver with ascites: Principal | ICD-10-CM

## 2019-02-14 DIAGNOSIS — D696 Thrombocytopenia, unspecified: Secondary | ICD-10-CM | POA: Diagnosis present

## 2019-02-14 DIAGNOSIS — E8729 Other acidosis: Secondary | ICD-10-CM | POA: Diagnosis present

## 2019-02-14 DIAGNOSIS — R188 Other ascites: Secondary | ICD-10-CM | POA: Diagnosis present

## 2019-02-14 LAB — CBC
HCT: UNDETERMINED % (ref 39.0–52.0)
Hemoglobin: 13.5 g/dL (ref 13.0–17.0)
Hemoglobin: 11.4 g/dL — ABNORMAL LOW (ref 13.0–17.0)
MCH: UNDETERMINED pg (ref 26.0–34.0)
MCHC: UNDETERMINED g/dL (ref 30.0–36.0)
MCV: UNDETERMINED fL (ref 80.0–100.0)
Platelets: 44 10*3/uL — ABNORMAL LOW (ref 150–400)
Platelets: 42 10*3/uL — ABNORMAL LOW (ref 150–400)
RBC: UNDETERMINED MIL/uL (ref 4.22–5.81)
RDW: UNDETERMINED % (ref 11.5–15.5)
WBC: 8.3 10*3/uL (ref 4.0–10.5)
WBC: 8.5 10*3/uL (ref 4.0–10.5)
nRBC: 0.2 % (ref 0.0–0.2)
nRBC: 0 % (ref 0.0–0.2)

## 2019-02-14 LAB — URINALYSIS, ROUTINE W REFLEX MICROSCOPIC
Bacteria, UA: NONE SEEN
Glucose, UA: NEGATIVE mg/dL
Hgb urine dipstick: NEGATIVE
Ketones, ur: 20 mg/dL — AB
Leukocytes,Ua: NEGATIVE
Nitrite: NEGATIVE
Protein, ur: 30 mg/dL — AB
Specific Gravity, Urine: 1.02 (ref 1.005–1.030)
pH: 5 (ref 5.0–8.0)

## 2019-02-14 LAB — PHOSPHORUS: Phosphorus: 2.3 mg/dL — ABNORMAL LOW (ref 2.5–4.6)

## 2019-02-14 LAB — HEPATIC FUNCTION PANEL
ALT: 129 U/L — ABNORMAL HIGH (ref 0–44)
AST: 478 U/L — ABNORMAL HIGH (ref 15–41)
Albumin: 2.4 g/dL — ABNORMAL LOW (ref 3.5–5.0)
Alkaline Phosphatase: 128 U/L — ABNORMAL HIGH (ref 38–126)
Bilirubin, Direct: 3.6 mg/dL — ABNORMAL HIGH (ref 0.0–0.2)
Indirect Bilirubin: 4.1 mg/dL — ABNORMAL HIGH (ref 0.3–0.9)
Total Bilirubin: 7.7 mg/dL — ABNORMAL HIGH (ref 0.3–1.2)
Total Protein: 7.7 g/dL (ref 6.5–8.1)

## 2019-02-14 LAB — BASIC METABOLIC PANEL
Anion gap: 14 (ref 5–15)
BUN: 11 mg/dL (ref 6–20)
CO2: 23 mmol/L (ref 22–32)
Calcium: 7.8 mg/dL — ABNORMAL LOW (ref 8.9–10.3)
Chloride: 98 mmol/L (ref 98–111)
Creatinine, Ser: 0.93 mg/dL (ref 0.61–1.24)
GFR calc Af Amer: >60 mL/min (ref >60)
GFR calc non Af Amer: >60 mL/min (ref >60)
Glucose, Bld: 118 mg/dL — ABNORMAL HIGH (ref 70–99)
Potassium: 3.8 mmol/L (ref 3.5–5.1)
Sodium: 135 mmol/L (ref 135–145)

## 2019-02-14 LAB — LACTIC ACID, PLASMA: Lactic Acid, Venous: 2.4 mmol/L (ref 0.5–1.9)

## 2019-02-14 LAB — AMMONIA: Ammonia: 31 umol/L (ref 9–35)

## 2019-02-14 LAB — SALICYLATE LEVEL: Salicylate Lvl: <7 mg/dL (ref 2.8–30.0)

## 2019-02-14 LAB — MAGNESIUM: Magnesium: 2.1 mg/dL (ref 1.7–2.4)

## 2019-02-14 LAB — PROTIME-INR
INR: 2.2 — ABNORMAL HIGH (ref 0.8–1.2)
Prothrombin Time: 24 seconds — ABNORMAL HIGH (ref 11.4–15.2)

## 2019-02-14 LAB — APTT: aPTT: 40 seconds — ABNORMAL HIGH (ref 24–36)

## 2019-02-14 LAB — HIV ANTIBODY (ROUTINE TESTING W REFLEX): HIV Screen 4th Generation wRfx: NONREACTIVE

## 2019-02-14 MED ORDER — LORAZEPAM 2 MG/ML IJ SOLN
2.0000 mg | INTRAMUSCULAR | Status: DC | PRN
  Administered 2019-02-14: 2 mg via INTRAVENOUS
  Filled 2019-02-14 (×2): qty 1

## 2019-02-14 MED ORDER — PREDNISONE 20 MG PO TABS
40.0000 mg | ORAL_TABLET | Freq: Every day | ORAL | Status: DC
  Administered 2019-02-14 – 2019-02-15 (×2): 40 mg via ORAL
  Filled 2019-02-14 (×2): qty 2

## 2019-02-14 MED ORDER — POTASSIUM CHLORIDE CRYS ER 20 MEQ PO TBCR
60.0000 meq | EXTENDED_RELEASE_TABLET | Freq: Once | ORAL | Status: AC
  Administered 2019-02-14: 60 meq via ORAL
  Filled 2019-02-14: qty 3

## 2019-02-14 MED ORDER — PANTOPRAZOLE SODIUM 40 MG IV SOLR
40.0000 mg | Freq: Two times a day (BID) | INTRAVENOUS | Status: DC
  Administered 2019-02-14 – 2019-02-15 (×4): 40 mg via INTRAVENOUS
  Filled 2019-02-14 (×4): qty 40

## 2019-02-14 MED ORDER — VITAMIN B-1 100 MG PO TABS
100.0000 mg | ORAL_TABLET | Freq: Every day | ORAL | Status: DC
  Administered 2019-02-14 – 2019-02-16 (×3): 100 mg via ORAL
  Filled 2019-02-14 (×3): qty 1

## 2019-02-14 MED ORDER — PREDNISOLONE 5 MG PO TABS: 40.0000 mg | ORAL_TABLET | Freq: Every day | ORAL | Status: DC

## 2019-02-14 MED ORDER — SODIUM CHLORIDE 0.9 % IV BOLUS
1000.0000 mL | Freq: Once | INTRAVENOUS | Status: AC
  Administered 2019-02-14: 1000 mL via INTRAVENOUS

## 2019-02-14 MED ORDER — ADULT MULTIVITAMIN W/MINERALS CH
1.0000 | ORAL_TABLET | Freq: Every day | ORAL | Status: DC
  Administered 2019-02-14 – 2019-02-16 (×3): 1 via ORAL
  Filled 2019-02-14 (×3): qty 1

## 2019-02-14 MED ORDER — FOLIC ACID 1 MG PO TABS
1.0000 mg | ORAL_TABLET | Freq: Every day | ORAL | Status: DC
  Administered 2019-02-14 – 2019-02-16 (×3): 1 mg via ORAL
  Filled 2019-02-14 (×3): qty 1

## 2019-02-14 MED ORDER — CHLORDIAZEPOXIDE HCL 5 MG PO CAPS
25.0000 mg | ORAL_CAPSULE | Freq: Three times a day (TID) | ORAL | Status: DC
  Administered 2019-02-14: 25 mg via ORAL
  Filled 2019-02-14 (×2): qty 5

## 2019-02-14 MED ORDER — LORAZEPAM 1 MG PO TABS
2.0000 mg | ORAL_TABLET | Freq: Once | ORAL | Status: AC
  Administered 2019-02-14: 2 mg via ORAL
  Filled 2019-02-14: qty 2

## 2019-02-14 MED ORDER — METOPROLOL TARTRATE 25 MG PO TABS
25.0000 mg | ORAL_TABLET | Freq: Two times a day (BID) | ORAL | Status: DC
  Administered 2019-02-14 – 2019-02-16 (×5): 25 mg via ORAL
  Filled 2019-02-14 (×5): qty 1

## 2019-02-14 NOTE — Progress Notes (Addendum)
Patient admitted after midnight. Hx etoh abuse, cirrhosis, htn, afib, presented with abdominal pain/distention as well as melena. Workup reveals worsening LFT's with total bili 8.4, ascites, mildly elevated lipase, thrombocytopenia, metabolic acidosis as well as signs of withdrawal.   Plan:  -gi consult -IR paracentesis if indicated -monitor closely    Toya Smothers, NP

## 2019-02-14 NOTE — ED Notes (Signed)
ED TO INPATIENT HANDOFF REPORT  ED Nurse Name and Phone #: [phone] Barry Harris  S Name/Age/Gender Barry Harris 48 y.o. male Room/Bed: 020C/020C  Code Status   Code Status: Prior  Home/SNF/Other Home Patient oriented to: self, place, time and situation Is this baseline? Yes   Triage Complete: Triage complete  Chief Complaint sob/abd pain  Triage Note Pt reports intermittent generalized abdominal pain and some mild SOB x 2 days,  Reports he stopped drinking 3 days ago and thinks it may be related.   Allergies No Known Allergies  Level of Care/Admitting Diagnosis ED Disposition    ED Disposition Condition Comment   Admit  Hospital Area: [facility] [[id number]]  Level of Care: Progressive [102]  I expect the patient will be discharged within 24 hours: No (not a candidate for 5C-Observation unit)  Covid Evaluation: N/A  Diagnosis: Ascites [743709]  Admitting Physician: John Giovanni [[id number]]  Attending Physician: John Giovanni [[id number]]  PT Class (Do Not Modify): Observation [104]  PT Acc Code (Do Not Modify): Observation [10022]       B Medical/Surgery History Past Medical History:  Diagnosis Date  . Alcohol abuse   . Atrial fibrillation (HCC)   . Elevated liver enzymes   . Hypertension    Past Surgical History:  Procedure Laterality Date  . SPINE SURGERY     48yo     A IV Location/Drains/Wounds Patient Lines/Drains/Airways Status   Active Line/Drains/Airways    Name:   Placement date:   Placement time:   Site:   Days:   Peripheral IV 11/04/18 Left;Anterior Forearm   11/04/18    0953    Forearm   102   Peripheral IV 02/14/19 Right Antecubital   02/14/19    0313    Antecubital   less than 1          Intake/Output Last 24 hours No intake or output data in the 24 hours ending 02/14/19 0503  Labs/Imaging Results for orders placed or performed during the hospital encounter of 02/13/19 (from the past 48 hour(s))  Lipase,  blood     Status: Abnormal   Collection Time: 02/13/19 11:07 PM  Result Value Ref Range   Lipase 59 (H) 11 - 51 U/L    Comment: Performed at [facility], [address] [place], [place] [address]  Comprehensive metabolic panel     Status: Abnormal   Collection Time: 02/13/19 11:07 PM  Result Value Ref Range   Sodium 137 135 - 145 mmol/L   Potassium 3.5 3.5 - 5.1 mmol/L   Chloride 97 (L) 98 - 111 mmol/L   CO2 18 (L) 22 - 32 mmol/L   Glucose, Bld 93 70 - 99 mg/dL   BUN 11 6 - 20 mg/dL   Creatinine, Ser 3.50 0.61 - 1.24 mg/dL   Calcium 8.5 (L) 8.9 - 10.3 mg/dL   Total Protein 9.0 (H) 6.5 - 8.1 g/dL   Albumin 2.9 (L) 3.5 - 5.0 g/dL   AST 093 (H) 15 - 41 U/L   ALT 152 (H) 0 - 44 U/L   Alkaline Phosphatase 159 (H) 38 - 126 U/L   Total Bilirubin 8.4 (H) 0.3 - 1.2 mg/dL   GFR calc non Af Amer >60 >60 mL/min   GFR calc Af Amer >60 >60 mL/min   Anion gap 22 (H) 5 - 15    Comment: Performed at [facility], [address] [place], [place] [address]  CBC  Status: Abnormal   Collection Time: 02/13/19 11:07 PM  Result Value Ref Range   WBC 8.3 4.0 - 10.5 K/uL   RBC Unable to determine due to a cold agglutinin 4.22 - 5.81 MIL/uL   Hemoglobin 13.5 13.0 - 17.0 g/dL   HCT Unable to determine due to a cold agglutinin 39.0 - 52.0 %   MCV Unable to determine due to a cold agglutinin 80.0 - 100.0 fL   MCH Unable to determine due to a cold agglutinin 26.0 - 34.0 pg   MCHC Unable to determine due to a cold agglutinin 30.0 - 36.0 g/dL   RDW Unable to determine due to a cold agglutinin 11.5 - 15.5 %   Platelets 44 (L) 150 - 400 K/uL    Comment: REPEATED TO VERIFY SPECIMEN CHECKED FOR CLOTS Immature Platelet Fraction may be clinically indicated, consider ordering this additional test [id number]    nRBC 0.2 0.0 - 0.2 %    Comment: Performed at [facility], [address] [place], [place] [address]  Urinalysis, Routine w reflex microscopic     Status: Abnormal   Collection  Time: 02/14/19  2:22 AM  Result Value Ref Range   Color, Urine AMBER (A) YELLOW    Comment: BIOCHEMICALS MAY BE AFFECTED BY COLOR   APPearance HAZY (A) CLEAR   Specific Gravity, Urine 1.020 1.005 - 1.030   pH 5.0 5.0 - 8.0   Glucose, UA NEGATIVE NEGATIVE mg/dL   Hgb urine dipstick NEGATIVE NEGATIVE   Bilirubin Urine MODERATE (A) NEGATIVE   Ketones, ur 20 (A) NEGATIVE mg/dL   Protein, ur 30 (A) NEGATIVE mg/dL   Nitrite NEGATIVE NEGATIVE   Leukocytes,Ua NEGATIVE NEGATIVE   RBC / HPF 0-5 0 - 5 RBC/hpf   WBC, UA 0-5 0 - 5 WBC/hpf   Bacteria, UA NONE SEEN NONE SEEN   Squamous Epithelial / LPF 0-5 0 - 5   Mucus PRESENT    Hyaline Casts, UA PRESENT     Comment: Performed at [facility], [address] [place], [place] [address]  Magnesium     Status: None   Collection Time: 02/14/19  2:23 AM  Result Value Ref Range   Magnesium 2.1 1.7 - 2.4 mg/dL    Comment: Performed at [facility], [address] [place], [place] [address]   No results found.  Pending Labs Unresulted Labs (From admission, onward)   None      Vitals/Pain Today's Vitals   02/14/19 0415 02/14/19 0424 02/14/19 0430 02/14/19 0445  BP:  (!) 169/86 (!) 162/72 (!) 160/77  Pulse: (!) 126 (!) 128 (!) 123 (!) 124  Resp: (!) 26 (!) 26 (!) 34 (!) 35  Temp:      TempSrc:      SpO2: 97% 95% 94% 99%  PainSc:        Isolation Precautions No active isolations  Medications Medications  sodium chloride flush (NS) 0.9 % injection 3 mL (has no administration in time range)  LORazepam (ATIVAN) tablet 2 mg (2 mg Oral Given 02/14/19 0314)  sodium chloride 0.9 % bolus 1,000 mL (1,000 mLs Intravenous New Bag/Given 02/14/19 0314)    Mobility walks with person assist Low fall risk   Focused Assessments    R Recommendations: See Admitting Provider Note  Report given to:   Additional Notes: N/A

## 2019-02-14 NOTE — Consult Note (Signed)
Consultation  Referring Provider: [facility] hospitalist/Ghimire Primary Care Physician:  Dorothyann Peng, NP Primary Gastroenterologist:  Dr.Perry- only seen once in our office 2018  Reason for Consultation:  Ascites, elevated LFT's  HPI: Barry Harris is a 48 y.o. male with long history of chronic alcohol abuse over the past 20 years.  Patient was admitted through the emergency room earlier today with complaints of new abdominal distention..  On further questioning he also mentioned that he has noticed dark stools off and on recently. Patient generally drinks 1 pint of vodka per day and 2-3 beers.  His last drink was 2 days ago he admitted to feeling shaky in the emergency room.  He is also noticing muscle twitching specially in his legs. Over the past month he has had some gradual increase in abdominal girth and intermittent discomfort.  He also started noticing eminent veins around the umbilicus.  He has not had any peripheral edema.  He denies any nausea vomiting or hematemesis.  No regular NSAID use.  Work-up in the emergency room with upper abdominal ultrasound shows no gallstones, gallbladder wall 5 mm there is evidence of diffuse hepatic steatosis or diffuse hepatocellular disease, there is probable recanalization of the umbilical vein consistent with cirrhosis, portal vein patent, and mild ascites.  Labs reviewed-T bili 7.7 alk phos 128 AST 478, AST 129.  Sodium 135, hemoglobin 11.4, platelets 42 and INR of 2.2.  Labs from January 2020 also showing significant LFT elevation with T bili of 5.2, AST 290 and ALT of 111.  Labs October 2018 similarly elevated with T bili of 3.3  He did undergo acute hepatitis serologies in 2018 which were negative, autoimmune hepatic markers all negative and hemochromatosis DNA mutation negative    Past Medical History:  Diagnosis Date  . Alcohol abuse   . Atrial fibrillation ([place])   . Elevated liver enzymes   . Hypertension     Past Surgical  History:  Procedure Laterality Date  . SPINE SURGERY     48yo    Prior to Admission medications   Medication Sig Start Date End Date Taking? Authorizing Provider  metoprolol tartrate (LOPRESSOR) 25 MG tablet TAKE 1 TABLET BY MOUTH TWICE A DAY Patient not taking: Reported on 02/14/2019 12/21/18   Dorothyann Peng, NP    Current Facility-Administered Medications  Medication Dose Route Frequency Provider Last Rate Last Dose  . chlordiazePOXIDE (LIBRIUM) capsule 25 mg  25 mg Oral Q8H Shela Leff, MD      . folic acid (FOLVITE) tablet 1 mg  1 mg Oral Daily Shela Leff, MD   1 mg at 02/14/19 0569  . LORazepam (ATIVAN) injection 2-3 mg  2-3 mg Intravenous Q1H PRN Shela Leff, MD   2 mg at 02/14/19 7948  . metoprolol tartrate (LOPRESSOR) tablet 25 mg  25 mg Oral BID Radene Gunning, NP   25 mg at 02/14/19 0165  . multivitamin with minerals tablet 1 tablet  1 tablet Oral Daily Shela Leff, MD   1 tablet at 02/14/19 [phone]  . pantoprazole (PROTONIX) injection 40 mg  40 mg Intravenous Q12H Barb Merino, MD   40 mg at 02/14/19 1025  . sodium chloride flush (NS) 0.9 % injection 3 mL  3 mL Intravenous Once Shela Leff, MD      . thiamine (VITAMIN B-1) tablet 100 mg  100 mg Oral Daily Shela Leff, MD   100 mg at 02/14/19 8270    Allergies as of 02/13/2019  . (No Known  Allergies)    Family History  Problem Relation Age of Onset  . Cancer Father        prostate  . Asthma Sister   . Diabetes Sister   . Diabetes Maternal Grandmother   . Heart disease Maternal Grandmother   . Diabetes Mother   . Hypertension Mother   . Breast cancer Sister     Social History   Socioeconomic History  . Marital status: Single    Spouse name: Not on file  . Number of children: Not on file  . Years of education: Not on file  . Highest education level: Not on file  Occupational History  . Occupation: [employer] for [employer]  .  Financial resource strain: Not on file  . Food insecurity:    Worry: Not on file    Inability: Not on file  . Transportation needs:    Medical: Not on file    Non-medical: Not on file  Tobacco Use  . Smoking status: Former Smoker    Last attempt to quit: 04/20/2001    Years since quitting: 17.8  . Smokeless tobacco: Never Used  Substance and Sexual Activity  . Alcohol use: Yes    Alcohol/week: 3.0 standard drinks    Types: 3 Standard drinks or equivalent per week    Comment: former drinker/ drinking problem /  . Drug use: No  . Sexual activity: Not Currently  Lifestyle  . Physical activity:    Days per week: Not on file    Minutes per session: Not on file  . Stress: Not on file  Relationships  . Social connections:    Talks on phone: Not on file    Gets together: Not on file    Attends religious service: Not on file    Active member of club or organization: Not on file    Attends meetings of clubs or organizations: Not on file    Relationship status: Not on file  . Intimate partner violence:    Fear of current or ex partner: Not on file    Emotionally abused: Not on file    Physically abused: Not on file    Forced sexual activity: Not on file  Other Topics Concern  . 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       Exercise: Lifts weights some. Rides stationary bike 2-3 times per week -but does this in spurts.                    Does not consistently exercise and stay on routine.      Diet: Fairly careful with what he eats.    Review of Systems: Pertinent positive and negative review of systems were noted in the above HPI section.  All other review of systems was otherwise negative.  Physical Exam: Vital signs in last 24 hours: Temp:  [98.4 F (36.9 C)-98.8 F (37.1 C)] 98.8 F (37.1 C) (05/05 0623) Pulse Rate:  [121-128] 121 (05/05 0918) Resp:  [16-35] 30 (05/05 0600) BP:  (150-175)/(66-96) 150/82 (05/05 0918) SpO2:  [94 %-99 %] 94 % (05/05 0623) Weight:  [967 kg] 112 kg (05/05 5916)   General:   Alert,  Well-developed, well-nourished, African-American male pleasant and cooperative in NAD Head:  Normocephalic and atraumatic. Eyes:  Sclera icteric   conjunctiva pink. Ears:  Normal  auditory acuity. Nose:  No deformity, discharge,  or lesions. Mouth:  No deformity or lesions.   Neck:  Supple; no masses or thyromegaly. Lungs:  Clear throughout to auscultation.   No wheezes, crackles, or rhonchi.  Heart:  Regular rate and rhythm; no murmurs, clicks, rubs,  or gallops. Abdomen:  Soft,nontender, BS active,nonpalp mass or hsm.,  Dominant periumbilical veins, no appreciable fluid wave. Rectal:  Deferred  Msk:  Symmetrical without gross deformities. . Pulses:  Normal pulses noted. Extremities:  Without clubbing or edema. Neurologic:  Alert and  oriented x4;  grossly normal neurologically.  No asterixis Skin:  Intact without significant lesions or rashes.. Psych:  Alert and cooperative. Normal mood and affect.  Intake/Output from previous day: No intake/output data recorded. Intake/Output this shift: Total I/O In: 120 [P.O.:120] Out: -   Lab Results: Recent Labs    02/13/19 2307 02/14/19 0632  WBC 8.3 8.5  HGB 13.5 11.4*  HCT Unable to determine due to a cold agglutinin RESULTS UNAVAILABLE DUE TO INTERFERING SUBSTANCE  PLT 44* 42*   BMET Recent Labs    02/13/19 2307 02/14/19 0632  NA 137 135  K 3.5 3.8  CL 97* 98  CO2 18* 23  GLUCOSE 93 118*  BUN 11 11  CREATININE 1.01 0.93  CALCIUM 8.5* 7.8*   LFT Recent Labs    02/14/19 0632  PROT 7.7  ALBUMIN 2.4*  AST 478*  ALT 129*  ALKPHOS 128*  BILITOT 7.7*  BILIDIR 3.6*  IBILI 4.1*   PT/INR Recent Labs    02/14/19 0632  LABPROT 24.0*  INR 2.2*   Hepatitis Panel No results for input(s): HEPBSAG, HCVAB, HEPAIGM, HEPBIGM in the last 72 hours.   IMPRESSION:   #55 48 year old  African-American male long-term history of chronic alcohol abuse, presenting with recent abdominal girth over the past few weeks. Ultrasound is consistent with cirrhosis, recanalization of the umbilical vein and mild ascites noted He does not have any evidence of significant volume overload and no peripheral edema  #2 chronically elevated LFTs consistent with chronic alcoholic hepatitis #3 thrombocytopenia and coagulopathy consistent with decompensated alcoholic cirrhosis-current MELD=23 and therefore 32-monthmortality rate of 19%.  He does not have evidence of acute alcoholic hepatitis and therefore believe he is appropriate for the duration of steroids. #4 alcohol withdrawal-no overt DTs as yet #5 history of atrial fibrillation #6.  Hypertension  PLAN: Will schedule for paracentesis, primarily for diagnostic purposes but may remove a few liters if at able Start 2 g sodium diet We will check magnesium and phosphorus, check AFP Continue CIWA protocol with thiamine replacement She will need eventual EGD for Variceal screening, he is not actively bleeding at present Patient was encouraged to stay put in the hospital until he gets through EtOH detox, and was strongly encouraged to immediately get involved with AA on discharge.  He had been involved with AA in the past.  We discussed his severely decompensated cirrhosis. He was told that he needs to discontinue all alcohol forever, and was advised that his risk for dying from his alcoholic liver disease will be quite high in the near future with continued EtOH use    Barry Harris  02/14/2019, 11:54 AM

## 2019-02-14 NOTE — Progress Notes (Signed)
CRITICAL VALUE ALERT  Critical Value:  Lactic Acid 2.4  Date & Time Notied:  02/14/2019 8:15 AM   Provider Notified: Dr. Jerral Ralph  Orders Received/Actions taken: Text paged MD Kathryne Hitch

## 2019-02-14 NOTE — H&P (Addendum)
History and Physical    DOMNIC Harris GLO:[id number] DOB: 04-04-1971 DOA: 02/13/2019  PCP: Dorothyann Peng, NP Patient coming from: Home  Chief Complaint: Abdominal distention  HPI: Barry Harris is a 48 y.o. male with medical history significant of alcohol abuse, A. fib, hypertension presenting to the hospital for evaluation of abdominal distention.  Patient states he has noticed that for the past few days his abdomen has been swollen and tight.  His appetite is reduced.  States sometimes he notices his stools look a little dark and sometimes red in appearance.  States he does eat beets.  States he has been drinking heavily for the past 20 years.  He drinks a pint of vodka and a few cans of 24 ounce beer on a daily basis.  States his last drink was 2 days ago.  He has been feeling shaky.  No additional history could be obtained from the patient.  Review of Systems: As per HPI otherwise 10 point review of systems negative.  Past Medical History:  Diagnosis Date   Alcohol abuse    Atrial fibrillation (HCC)    Elevated liver enzymes    Hypertension     Past Surgical History:  Procedure Laterality Date   SPINE SURGERY     48yo     reports that he quit smoking about 17 years ago. He has never used smokeless tobacco. He reports current alcohol use of about 3.0 standard drinks of alcohol per week. He reports that he does not use drugs.  No Known Allergies  Family History  Problem Relation Age of Onset   Cancer Father        prostate   Asthma Sister    Diabetes Sister    Diabetes Maternal Grandmother    Heart disease Maternal Grandmother    Diabetes Mother    Hypertension Mother    Breast cancer Sister     Prior to Admission medications   Medication Sig Start Date End Date Taking? Authorizing Provider  metoprolol tartrate (LOPRESSOR) 25 MG tablet TAKE 1 TABLET BY MOUTH TWICE A DAY Patient not taking: Reported on 02/14/2019 12/21/18   Dorothyann Peng, NP     Physical Exam: Vitals:   02/14/19 0424 02/14/19 0430 02/14/19 0445 02/14/19 0515  BP: (!) 169/86 (!) 162/72 (!) 160/77 (!) 160/87  Pulse: (!) 128 (!) 123 (!) 124 (!) 127  Resp: (!) 26 (!) 34 (!) 35 (!) 32  Temp:      TempSrc:      SpO2: 95% 94% 99% 96%    Physical Exam  Constitutional: He is oriented to person, place, and time. He appears well-developed and well-nourished.  Appears tremulous  HENT:  Head: Normocephalic.  Mouth/Throat: Oropharynx is clear and moist.  Eyes: Right eye exhibits no discharge. Left eye exhibits no discharge. Scleral icterus is present.  Neck: Neck supple.  Cardiovascular: Regular rhythm and intact distal pulses.  Slightly tachycardic  Pulmonary/Chest: Effort normal and breath sounds normal. No respiratory distress. He has no wheezes. He has no rales.  Abdominal: Soft. Bowel sounds are normal. He exhibits distension. There is no abdominal tenderness. There is no rebound and no guarding.  Musculoskeletal:        General: No edema.  Neurological: He is alert and oriented to person, place, and time.  Skin: Skin is warm and dry. He is not diaphoretic.     Labs on Admission: I have personally reviewed following labs and imaging studies  CBC: Recent Labs  Lab 02/13/19  2307  WBC 8.3  HGB 13.5  HCT Unable to determine due to a cold agglutinin  MCV Unable to determine due to a cold agglutinin  PLT 44*   Basic Metabolic Panel: Recent Labs  Lab 02/13/19 2307 02/14/19 0223  NA 137  --   K 3.5  --   CL 97*  --   CO2 18*  --   GLUCOSE 93  --   BUN 11  --   CREATININE 1.01  --   CALCIUM 8.5*  --   MG  --  2.1   GFR: CrCl cannot be calculated (Unknown ideal weight.). Liver Function Tests: Recent Labs  Lab 02/13/19 2307  AST 540*  ALT 152*  ALKPHOS 159*  BILITOT 8.4*  PROT 9.0*  ALBUMIN 2.9*   Recent Labs  Lab 02/13/19 2307  LIPASE 59*   No results for input(s): AMMONIA in the last 168 hours. Coagulation Profile: No results  for input(s): INR, PROTIME in the last 168 hours. Cardiac Enzymes: No results for input(s): CKTOTAL, CKMB, CKMBINDEX, TROPONINI in the last 168 hours. BNP (last 3 results) No results for input(s): PROBNP in the last 8760 hours. HbA1C: No results for input(s): HGBA1C in the last 72 hours. CBG: No results for input(s): GLUCAP in the last 168 hours. Lipid Profile: No results for input(s): CHOL, HDL, LDLCALC, TRIG, CHOLHDL, LDLDIRECT in the last 72 hours. Thyroid Function Tests: No results for input(s): TSH, T4TOTAL, FREET4, T3FREE, THYROIDAB in the last 72 hours. Anemia Panel: No results for input(s): VITAMINB12, FOLATE, FERRITIN, TIBC, IRON, RETICCTPCT in the last 72 hours. Urine analysis:    Component Value Date/Time   COLORURINE AMBER (A) 02/14/2019 0222   APPEARANCEUR HAZY (A) 02/14/2019 0222   LABSPEC 1.020 02/14/2019 0222   PHURINE 5.0 02/14/2019 0222   GLUCOSEU NEGATIVE 02/14/2019 0222   HGBUR NEGATIVE 02/14/2019 0222   BILIRUBINUR MODERATE (A) 02/14/2019 0222   KETONESUR 20 (A) 02/14/2019 0222   PROTEINUR 30 (A) 02/14/2019 0222   NITRITE NEGATIVE 02/14/2019 0222   LEUKOCYTESUR NEGATIVE 02/14/2019 0222    Radiological Exams on Admission: No results found.  EKG: Independently reviewed.  Sinus tachycardia (heart rate 124), QTc 516, nonspecific T wave abnormality.  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  Noted to have jaundice and abdominal distention on exam.  No abdominal tenderness.  Albumin 2.9.  Lipase mildly elevated at 59.  LFTs are chronically elevated but now worse.  AST 540, ALT 152, alk phos 159, and T bili 8.4.  Platelet count 44.  Low suspicion for SBP given no abdominal tenderness, fever, leukocytosis, or altered mental  status. -Abdominal ultrasound to assess for ascites and liver appearance. Will likely need paracentesis.  -Repeat LFTs in a.m. -Fractionated bilirubin level -PT/INR, aPTT -Dietary sodium restriction -Patient reports history of intermittent melena and hematochezia.  Hemoglobin 13.5.  Check FOBT. -Consult GI in a.m.  Alcohol withdrawal Patient has a history of alcohol dependence.  Last drink 2 days ago.  Tachycardic and blood pressure elevated.  Appears tremulous. -Monitor in the stepdown unit -Librium 25 mg every 8 hours -CIWA protocol; Ativan PRN -Thiamine, folate, multivitamin  High anion gap metabolic acidosis Bicarb 18, anion gap 22.  Likely secondary to history of alcohol use. -Check lactic acid and salicylate levels -Continue to  monitor BMP  Hypertension Blood pressure elevated in the setting of alcohol withdrawal. -Hydralazine PRN -Management of alcohol withdrawal as mentioned above  Chronic thrombocytopenia Platelet count 44.  Suspect related to alcohol use and liver cirrhosis.  No signs of active bleeding at this time. -No anticoagulation for DVT prophylaxis.  SCDs at this time. -Continue to monitor CBC  QTC prolongation on EKG -Keep K >4 and Mag >2  DVT prophylaxis: SCDs Code Status: Full code Family Communication: No family available. Disposition Plan: Anticipate discharge after clinical improvement. Consults called: None Admission status: Observation, progressive care unit  This chart was dictated using voice recognition software.  Despite best efforts to proofread, errors can occur which can change the documentation meaning.  Shela Leff MD [facility] Pager [phone]  If 7PM-7AM, please contact night-coverage [email or web address] Password [email or web address]  02/14/2019, 5:29 AM

## 2019-02-14 NOTE — ED Provider Notes (Signed)
[facility] EMERGENCY DEPARTMENT Provider Note   CSN: [id number] Arrival date & time: 02/13/19  2238    History   Chief Complaint Chief Complaint  Patient presents with  . Abdominal Pain  . Shortness of Breath    HPI Barry Harris is a 48 y.o. male.   The history is provided by the patient.  He has history of hypertension, hyperlipidemia, alcohol abuse, atrial fibrillation and comes in because of abdominal distention.  He did notice his abdomen was distended about 2 days ago.  He describes it as feeling tight.  He did have some nausea but no vomiting.  He denies any actual abdominal pain.  He denies any constipation or diarrhea.  He does admit to drinking a pint of vodka a day with last drink being 2 days ago.  He and notes that he is shaky.  He has had DTs but no alcohol withdrawal seizures.  Denies any hallucinations.  He has noted that he bruises easily.  Past Medical History:  Diagnosis Date  . Alcohol abuse   . Atrial fibrillation (HCC)   . Elevated liver enzymes   . Hypertension     Patient Active Problem List   Diagnosis Date Noted  . Hypertriglyceridemia 12/04/2014  . Palpitations 11/30/2014  . Elevated transaminase level 11/30/2014  . Alcohol abuse 11/30/2014  . Hyponatremia 11/30/2014  . Transaminitis   . GAD (generalized anxiety disorder) 06/12/2014  . Obesity 05/08/2014  . Hypertension     Past Surgical History:  Procedure Laterality Date  . SPINE SURGERY     48yo        Home Medications    Prior to Admission medications   Medication Sig Start Date End Date Taking? Authorizing Provider  metoprolol tartrate (LOPRESSOR) 25 MG tablet TAKE 1 TABLET BY MOUTH TWICE A DAY 12/21/18   Nafziger, Kandee Keen, NP  Multiple Vitamin (MULTIVITAMIN WITH MINERALS) TABS tablet Take 1 tablet by mouth daily.    [provider]    Family History Family History  Problem Relation Age of Onset  . Cancer Father        prostate  . Asthma  Sister   . Diabetes Sister   . Diabetes Maternal Grandmother   . Heart disease Maternal Grandmother   . Diabetes Mother   . Hypertension Mother   . Breast cancer Sister     Social History Social History   Tobacco Use  . Smoking status: Former Smoker    Last attempt to quit: 04/20/2001    Years since quitting: 17.8  . Smokeless tobacco: Never Used  Substance Use Topics  . Alcohol use: Yes    Alcohol/week: 3.0 standard drinks    Types: 3 Standard drinks or equivalent per week    Comment: former drinker/ drinking problem /  . Drug use: No     Allergies   Patient has no known allergies.   Review of Systems Review of Systems  All other systems reviewed and are negative.    Physical Exam Updated Vital Signs BP (!) 171/96 (BP Location: Right Arm)   Pulse (!) 126   Temp 98.4 F (36.9 C) (Oral)   Resp 16   SpO2 98%   Physical Exam Vitals signs and nursing note reviewed.    48 year old male, resting comfortably and in no acute distress. Vital signs are significant for elevated blood pressure and elevated heart rate. Oxygen saturation is 98%, which is normal. Head is normocephalic and atraumatic. PERRLA, EOMI. Oropharynx  is clear.  Sclerae are icteric. Neck is nontender and supple without adenopathy or JVD. Back is nontender and there is no CVA tenderness. Lungs are clear without rales, wheezes, or rhonchi. Chest is nontender. Heart has regular rate and rhythm without murmur. Abdomen is soft, mildly distended, with liver edge palpable and has mild to moderate tenderness.  No other abdominal tenderness.  Spleen is not enlarged.  Peristalsis is normoactive.  Positive fluid wave consistent with ascites. Extremities have no cyanosis or edema, full range of motion is present. Skin is warm and dry without rash. Neurologic: Mental status is normal, cranial nerves are intact, there are no motor or sensory deficits.  He is mildly tremulous.  ED Treatments / Results  Labs  (all labs ordered are listed, but only abnormal results are displayed) Labs Reviewed  LIPASE, BLOOD - Abnormal; Notable for the following components:      Result Value   Lipase 59 (*)    All other components within normal limits  COMPREHENSIVE METABOLIC PANEL - Abnormal; Notable for the following components:   Chloride 97 (*)    CO2 18 (*)    Calcium 8.5 (*)    Total Protein 9.0 (*)    Albumin 2.9 (*)    AST 540 (*)    ALT 152 (*)    Alkaline Phosphatase 159 (*)    Total Bilirubin 8.4 (*)    Anion gap 22 (*)    All other components within normal limits  CBC - Abnormal; Notable for the following components:   Platelets 44 (*)    All other components within normal limits  URINALYSIS, ROUTINE W REFLEX MICROSCOPIC - Abnormal; Notable for the following components:   Color, Urine AMBER (*)    APPearance HAZY (*)    Bilirubin Urine MODERATE (*)    Ketones, ur 20 (*)    Protein, ur 30 (*)    All other components within normal limits  MAGNESIUM    EKG EKG Interpretation  Date/Time:  Tuesday Feb 14 2019 02:13:42 EDT Ventricular Rate:  124 PR Interval:    QRS Duration: 86 QT Interval:  359 QTC Calculation: 516 R Axis:   55 Text Interpretation:  Sinus tachycardia Low voltage, precordial leads Probable anteroseptal infarct, old Borderline T abnormalities, inferior leads Prolonged QT interval When compared with ECG of 07/20/2017, QT has lengthened Confirmed by Dione BoozeGlick, Raye Slyter ([id number]) on 02/14/2019 2:15:49 AM  Procedures Procedures  Medications Ordered in ED Medications  sodium chloride flush (NS) 0.9 % injection 3 mL (has no administration in time range)  LORazepam (ATIVAN) tablet 2 mg (2 mg Oral Given 02/14/19 0314)  sodium chloride 0.9 % bolus 1,000 mL (1,000 mLs Intravenous New Bag/Given 02/14/19 0314)     Initial Impression / Assessment and Plan / ED Course  I have reviewed the triage vital signs and the nursing notes.  Pertinent labs & imaging results that were available during  my care of the patient were reviewed by me and considered in my medical decision making (see chart for details).  Apparently new onset ascites.  Old records are reviewed, and he has been seen in the ED several times for alcohol withdrawal.  No documentation of ascites on previous office visits or ED visits.  Labs do show worsening jaundice and worsening ALT, AST, alkaline phosphatase.  Mild elevation of lipase is not felt to be clinically significant.  He is in alcohol withdrawal, and I am concerned about elevation in blood pressure and heart rate.  He will  be given IV fluids and a dose of lorazepam.  CBC is still pending.  CBC is significant for thrombocytopenia which is about in the same range it had been in previously, presumably secondary to cirrhosis.  He was given IV fluids, but heart rate remained tachycardic.  Blood pressure has come down, but is still elevated.  Because of ketoacidosis, persistent tachycardia, persistent hypertension it is felt that he needs to be managed as an inpatient.  He is still moderately tremulous.  Case is discussed with Dr. Loney Loh of [facility], who agrees to admit the patient.  Final Clinical Impressions(s) / ED Diagnoses   Final diagnoses:  Alcohol withdrawal syndrome with complication (HCC)  Alcoholic ketoacidosis  Ascites due to alcoholic cirrhosis (HCC)  Elevated liver enzymes  Jaundice  Thrombocytopenia [facility])    ED Discharge Orders    None       Dione Booze, MD 02/14/19 [phone]

## 2019-02-15 ENCOUNTER — Observation Stay (HOSPITAL_COMMUNITY): Payer: Self-pay

## 2019-02-15 DIAGNOSIS — F10239 Alcohol dependence with withdrawal, unspecified: Secondary | ICD-10-CM

## 2019-02-15 DIAGNOSIS — E872 Acidosis: Secondary | ICD-10-CM

## 2019-02-15 LAB — COMPREHENSIVE METABOLIC PANEL
ALT: 107 U/L — ABNORMAL HIGH (ref 0–44)
AST: 384 U/L — ABNORMAL HIGH (ref 15–41)
Albumin: 2.1 g/dL — ABNORMAL LOW (ref 3.5–5.0)
Alkaline Phosphatase: 124 U/L (ref 38–126)
Anion gap: 11 (ref 5–15)
BUN: 9 mg/dL (ref 6–20)
CO2: 23 mmol/L (ref 22–32)
Calcium: 8 mg/dL — ABNORMAL LOW (ref 8.9–10.3)
Chloride: 101 mmol/L (ref 98–111)
Creatinine, Ser: 0.99 mg/dL (ref 0.61–1.24)
GFR calc Af Amer: >60 mL/min (ref >60)
GFR calc non Af Amer: >60 mL/min (ref >60)
Glucose, Bld: 97 mg/dL (ref 70–99)
Potassium: 3.8 mmol/L (ref 3.5–5.1)
Sodium: 135 mmol/L (ref 135–145)
Total Bilirubin: 6.8 mg/dL — ABNORMAL HIGH (ref 0.3–1.2)
Total Protein: 7.1 g/dL (ref 6.5–8.1)

## 2019-02-15 LAB — CBC
Hemoglobin: 12 g/dL — ABNORMAL LOW (ref 13.0–17.0)
Platelets: 41 10*3/uL — ABNORMAL LOW (ref 150–400)
WBC: 5.3 10*3/uL (ref 4.0–10.5)

## 2019-02-15 LAB — PROTIME-INR
INR: 2.3 — ABNORMAL HIGH (ref 0.8–1.2)
Prothrombin Time: 25 seconds — ABNORMAL HIGH (ref 11.4–15.2)

## 2019-02-15 LAB — AFP TUMOR MARKER: AFP, Serum, Tumor Marker: 2.7 ng/mL (ref 0.0–8.3)

## 2019-02-15 LAB — MAGNESIUM: Magnesium: 1.9 mg/dL (ref 1.7–2.4)

## 2019-02-15 MED ORDER — VITAMIN K1 10 MG/ML IJ SOLN
10.0000 mg | Freq: Every day | INTRAMUSCULAR | Status: DC
  Administered 2019-02-15: 10 mg via SUBCUTANEOUS
  Filled 2019-02-15 (×2): qty 1

## 2019-02-15 NOTE — Progress Notes (Signed)
Patient ID: ROMAIN KAISER, male   DOB: 1971/05/26, 48 y.o.   MRN: [id number]     Progress Note   Subjective  Pt says he feels better overall, no overt Dt's, no c/o abdominal pain , and feels less" full", able to walk halls INR 2.3 Phos 2.3 AFP 2.7 Paracentesis pending   Objective   Vital signs in last 24 hours: Temp:  [97.9 F (36.6 C)-98.4 F (36.9 C)] 98.4 F (36.9 C) (05/06 0849) Pulse Rate:  [82-107] 93 (05/06 0849) Resp:  [18-31] 18 (05/06 0849) BP: (128-131)/(83-95) 131/87 (05/06 0849) SpO2:  [94 %-96 %] 96 % (05/06 0849) Last BM Date: 02/14/19 General:    AA male  in NAD, jaundiced Heart:  Regular rate and rhythm; no murmurs Lungs: Respirations even and unlabored, lungs CTA bilaterally Abdomen:  Soft, nontender and nondistended. Normal bowel sounds, no appreciable ascites, prominent periumbilical veins Extremities:  Without edema. Neurologic:  Alert and oriented,  grossly normal neurologically. Psych:  Cooperative. Normal mood and affect.  Intake/Output from previous day: 05/05 0701 - 05/06 0700 In: 270 [P.O.:270] Out: 1000 [Urine:1000] Intake/Output this shift: Total I/O In: 360 [P.O.:360] Out: -   Lab Results: Recent Labs    02/13/19 2307 02/14/19 0632 02/15/19 0324  WBC 8.3 8.5 5.3  HGB 13.5 11.4* 12.0*  HCT Unable to determine due to a cold agglutinin RESULTS UNAVAILABLE DUE TO INTERFERING SUBSTANCE RESULTS UNAVAILABLE DUE TO INTERFERING SUBSTANCE  PLT 44* 42* 41*   BMET Recent Labs    02/13/19 2307 02/14/19 0632 02/15/19 0324  NA 137 135 135  K 3.5 3.8 3.8  CL 97* 98 101  CO2 18* 23 23  GLUCOSE 93 118* 97  BUN 11 11 9   CREATININE 1.01 0.93 0.99  CALCIUM 8.5* 7.8* 8.0*   LFT Recent Labs    02/14/19 0632 02/15/19 0324  PROT 7.7 7.1  ALBUMIN 2.4* 2.1*  AST 478* 384*  ALT 129* 107*  ALKPHOS 128* 124  BILITOT 7.7* 6.8*  BILIDIR 3.6*  --   IBILI 4.1*  --    PT/INR Recent Labs    02/14/19 0632 02/15/19 0324  LABPROT 24.0*  25.0*  INR 2.2* 2.3*    Studies/Results: US Abdomen Limited Ruq  Result Date: 02/14/2019 CLINICAL DATA:  Abdominal distension, elevated liver function tests. EXAM: ULTRASOUND ABDOMEN LIMITED RIGHT UPPER QUADRANT COMPARISON:  Ultrasound of November 30, 2014. FINDINGS: Gallbladder: No cholelithiasis is noted, but mild gallbladder wall thickening is noted measured at 5 mm. No sonographic Murphy's sign is noted. Common bile duct: Diameter: 6 mm which is within normal limits. Liver: Increased echogenicity of hepatic parenchyma is noted suggesting hepatic steatosis or other diffuse hepatocellular disease. Mild surrounding ascites is noted. There may be recanalization of the umbilical vein suggesting hepatic cirrhosis. Portal vein is patent on color Doppler imaging with normal direction of blood flow towards the liver. IMPRESSION: Increased echogenicity of hepatic parenchyma is noted suggesting hepatic steatosis or other diffuse hepatocellular disease. There is possible recanalization of the umbilical vein suggesting hepatic cirrhosis. Mild ascites is noted. Mild gallbladder wall thickening is noted which most likely is due to adjacent hepatocellular disease. No cholelithiasis is noted. Electronically Signed   By: Lupita Raider M.D.   On: 02/14/2019 08:39       Assessment / Plan:    #1 48 yo AA male with hx of chronic ETOH abuse, decompensated cirrhosis with new c/o abdominal distention, intermittent dark stools- no active bleeding since admit  Pt has significant  ETOH hepatitis with DF score of 53- started prednisolone 40 mg daily yesterday with plan for one month course. Lft's improved  MELD 23  Mild ascites on US - paracentesis  Primarily for diagnostic purposes - pending  No peripheral edema- don't think he needs diuretics at present , must stay on 2 gm sodium diet.  Has not had EGD for variceal screening- will complete while here  #2 ETOH withdrawal - in CIWA - doing well #3 HTN  Plan;   Paracentesis today Have scheduled for EGD with Dr Cirigliano  in am (10am ). Procedure discussed in detail with pt including indications, risks, benefits ans he is agreeable to proceed.  Vit K added for coagulopathy  If parameters stable/improved tomorrow may be able to d/c  In pm  He will need office follow up /labs  With GI - we will arrange  .         Principal Problem:   Ascites Active Problems:   HTN (hypertension)   Alcohol withdrawal (HCC)   High anion gap metabolic acidosis   Thrombocytopenia (HCC)   Prolonged Q-T interval on ECG   Elevated liver enzymes   Jaundice     LOS: 0 days   Doloros Kwolek  02/15/2019, 11:38 AM  

## 2019-02-15 NOTE — Progress Notes (Signed)
PROGRESS NOTE    Barry Harris  CBJ:[id number] DOB: 12/28/1970 DOA: 02/13/2019 PCP: Shirline Frees, NP    Brief Narrative:  48 y.o. male with a Past Medical History of alcoholic cirrhosis, previous serological work-up negative for hepatitis and autoimmune causes.  Continues to drink about a Pint a day, who presented with postprandial bloating sensation and unable to eat as well as abdominal distention.  Worsening LFTs, normal ammonia, meld score 23, continues to drink  Assessment & Plan:   Principal Problem:   Ascites Active Problems:   HTN (hypertension)   Alcohol withdrawal (HCC)   High anion gap metabolic acidosis   Thrombocytopenia (HCC)   Prolonged Q-T interval on ECG   Elevated liver enzymes   Jaundice  Ascites secondary to suspected alcoholic liver cirrhosis -Patient with history of alcohol dependence.   -Abdominal ultrasound with features suggesting cirrhosis with mild ascites -Attempted IR guided paracentesis 5/6, unsuccessful -Repeat LFTs are trending down -continue -Dietary sodium restriction -Patient reports history of intermittent melena and hematochezia.  Hemoglobin 13.5. -GI consulted, with plan for endoscopy while in hospital  Alcohol withdrawal -Patient has a history of alcohol dependence.  Last drink 2 days prior to visit.  Tachycardic and blood pressure elevated.  Appeared tremulous. -On Librium 25 mg every 8 hours -CIWA protocol; Ativan PRN -Thiamine, folate, multivitamin -Withdrawal symptoms improved  High anion gap metabolic acidosis Bicarb 18, anion gap 22.  Likely secondary to history of alcohol use. -repeat bmet in AM  Hypertension Blood pressure elevated in the setting of alcohol withdrawal. -Continue with hydralazine PRN -Management of alcohol withdrawal as mentioned above  Chronic thrombocytopenia -Platelet count 44 at time of presentation -Plts stable.  Suspect related to alcohol use and liver cirrhosis.  No signs of active  bleeding at this time. -No anticoagulation for DVT prophylaxis.  SCDs at this time. -Repeat CBC in AM  QTC prolongation on EKG -repeat bmet in AM -correct electrolytes as needed  DVT prophylaxis: SCD's Code Status: Full Family Communication: Pt in room, family not at bedside Disposition Plan: Anticipate home when stable and cleared by GI  Consultants:   GI  Procedures:     Antimicrobials: Anti-infectives (From admission, onward)   None       Subjective: Reports feeling better. No abd pain  Objective: Vitals:   02/14/19 2353 02/15/19 0432 02/15/19 0849 02/15/19 1226  BP: (!) 128/95 131/83 131/87 (!) 130/93  Pulse: (!) 107 82 93 79  Resp: (!) 22 (!) 29 18 17   Temp: 98.2 F (36.8 C) 98.1 F (36.7 C) 98.4 F (36.9 C) 98.2 F (36.8 C)  TempSrc: Oral Oral Oral Oral  SpO2: 95% 94% 96% 97%  Weight:      Height:        Intake/Output Summary (Last 24 hours) at 02/15/2019 1457 Last data filed at 02/15/2019 1230 Gross per 24 hour  Intake 870 ml  Output 1000 ml  Net -130 ml   Filed Weights   02/14/19 0623  Weight: 112 kg    Examination:  General exam: Appears calm and comfortable  Respiratory system: Clear to auscultation. Respiratory effort normal. Cardiovascular system: S1 & S2 heard, RRR Gastrointestinal system: Abdomen is nondistended, soft and nontender. No organomegaly or masses felt. Normal bowel sounds heard. Central nervous system: Alert and oriented. No focal neurological deficits. Extremities: Symmetric 5 x 5 power. Skin: No rashes, lesions Psychiatry: Judgement and insight appear normal. Mood & affect appropriate.   Data Reviewed: I have personally reviewed following labs and  imaging studies  CBC: Recent Labs  Lab 02/13/19 2307 02/14/19 0632 02/15/19 0324  WBC 8.3 8.5 5.3  HGB 13.5 11.4* 12.0*  HCT Unable to determine due to a cold agglutinin RESULTS UNAVAILABLE DUE TO INTERFERING SUBSTANCE RESULTS UNAVAILABLE DUE TO INTERFERING SUBSTANCE   MCV Unable to determine due to a cold agglutinin RESULTS UNAVAILABLE DUE TO INTERFERING SUBSTANCE RESULTS UNAVAILABLE DUE TO INTERFERING SUBSTANCE  PLT 44* 42* 41*   Basic Metabolic Panel: Recent Labs  Lab 02/13/19 2307 02/14/19 0223 02/14/19 0632 02/14/19 1521 02/15/19 0324  NA 137  --  135  --  135  K 3.5  --  3.8  --  3.8  CL 97*  --  98  --  101  CO2 18*  --  23  --  23  GLUCOSE 93  --  118*  --  97  BUN 11  --  11  --  9  CREATININE 1.01  --  0.93  --  0.99  CALCIUM 8.5*  --  7.8*  --  8.0*  MG  --  2.1  --   --  1.9  PHOS  --   --   --  2.3*  --    GFR: Estimated Creatinine Clearance: 118.7 mL/min (by C-G formula based on SCr of 0.99 mg/dL). Liver Function Tests: Recent Labs  Lab 02/13/19 2307 02/14/19 0632 02/15/19 0324  AST 540* 478* 384*  ALT 152* 129* 107*  ALKPHOS 159* 128* 124  BILITOT 8.4* 7.7* 6.8*  PROT 9.0* 7.7 7.1  ALBUMIN 2.9* 2.4* 2.1*   Recent Labs  Lab 02/13/19 2307  LIPASE 59*   Recent Labs  Lab 02/14/19 1036  AMMONIA 31   Coagulation Profile: Recent Labs  Lab 02/14/19 0632 02/15/19 0324  INR 2.2* 2.3*   Cardiac Enzymes: No results for input(s): CKTOTAL, CKMB, CKMBINDEX, TROPONINI in the last 168 hours. BNP (last 3 results) No results for input(s): PROBNP in the last 8760 hours. HbA1C: No results for input(s): HGBA1C in the last 72 hours. CBG: No results for input(s): GLUCAP in the last 168 hours. Lipid Profile: No results for input(s): CHOL, HDL, LDLCALC, TRIG, CHOLHDL, LDLDIRECT in the last 72 hours. Thyroid Function Tests: No results for input(s): TSH, T4TOTAL, FREET4, T3FREE, THYROIDAB in the last 72 hours. Anemia Panel: No results for input(s): VITAMINB12, FOLATE, FERRITIN, TIBC, IRON, RETICCTPCT in the last 72 hours. Sepsis Labs: Recent Labs  Lab 02/14/19 [id number]  LATICACIDVEN 2.4*    No results found for this or any previous visit (from the past 240 hour(s)).   Radiology Studies: Ir Abdomen [place] Limited  Result  Date: 02/15/2019 CLINICAL DATA:  48 year old male with abdominal distension for the past 3 days, history of alcohol abuse and elevated LFTs. Evaluate for ascites. EXAM: LIMITED ABDOMEN ULTRASOUND FOR ASCITES TECHNIQUE: Limited ultrasound survey for ascites was performed in all four abdominal quadrants. COMPARISON:  Right upper quadrant ultrasound performed yesterday 02/14/2019 FINDINGS: Sonographic interrogation of the 4 quadrants of the abdomen demonstrates no significant ascites. IMPRESSION: Negative for ascites. Electronically Signed   By: Malachy MoanHeath  McCullough M.D.   On: 02/15/2019 13:37   [place] Abdomen Limited Ruq  Result Date: 02/14/2019 CLINICAL DATA:  Abdominal distension, elevated liver function tests. EXAM: ULTRASOUND ABDOMEN LIMITED RIGHT UPPER QUADRANT COMPARISON:  Ultrasound of November 30, 2014. FINDINGS: Gallbladder: No cholelithiasis is noted, but mild gallbladder wall thickening is noted measured at 5 mm. No sonographic Murphy's sign is noted. Common bile duct: Diameter: 6 mm which is within  normal limits. Liver: Increased echogenicity of hepatic parenchyma is noted suggesting hepatic steatosis or other diffuse hepatocellular disease. Mild surrounding ascites is noted. There may be recanalization of the umbilical vein suggesting hepatic cirrhosis. Portal vein is patent on color Doppler imaging with normal direction of blood flow towards the liver. IMPRESSION: Increased echogenicity of hepatic parenchyma is noted suggesting hepatic steatosis or other diffuse hepatocellular disease. There is possible recanalization of the umbilical vein suggesting hepatic cirrhosis. Mild ascites is noted. Mild gallbladder wall thickening is noted which most likely is due to adjacent hepatocellular disease. No cholelithiasis is noted. Electronically Signed   By: Lupita Raider M.D.   On: 02/14/2019 08:39    Scheduled Meds: . chlordiazePOXIDE  25 mg Oral Q8H  . folic acid  1 mg Oral Daily  . metoprolol tartrate  25 mg  Oral BID  . multivitamin with minerals  1 tablet Oral Daily  . pantoprazole (PROTONIX) IV  40 mg Intravenous Q12H  . phytonadione  10 mg Subcutaneous Daily  . predniSONE  40 mg Oral Q breakfast  . sodium chloride flush  3 mL Intravenous Once  . thiamine  100 mg Oral Daily   Continuous Infusions:   LOS: 0 days   Rickey Barbara, MD [facility] Pager On Amion  If 7PM-7AM, please contact night-coverage 02/15/2019, 2:57 PM

## 2019-02-15 NOTE — H&P (View-Only) (Signed)
Patient ID: ROMAIN KAISER, male   DOB: 1971/05/26, 48 y.o.   MRN: [id number]     Progress Note   Subjective  Pt says he feels better overall, no overt Dt's, no c/o abdominal pain , and feels less" full", able to walk halls INR 2.3 Phos 2.3 AFP 2.7 Paracentesis pending   Objective   Vital signs in last 24 hours: Temp:  [97.9 F (36.6 C)-98.4 F (36.9 C)] 98.4 F (36.9 C) (05/06 0849) Pulse Rate:  [82-107] 93 (05/06 0849) Resp:  [18-31] 18 (05/06 0849) BP: (128-131)/(83-95) 131/87 (05/06 0849) SpO2:  [94 %-96 %] 96 % (05/06 0849) Last BM Date: 02/14/19 General:    AA male  in NAD, jaundiced Heart:  Regular rate and rhythm; no murmurs Lungs: Respirations even and unlabored, lungs CTA bilaterally Abdomen:  Soft, nontender and nondistended. Normal bowel sounds, no appreciable ascites, prominent periumbilical veins Extremities:  Without edema. Neurologic:  Alert and oriented,  grossly normal neurologically. Psych:  Cooperative. Normal mood and affect.  Intake/Output from previous day: 05/05 0701 - 05/06 0700 In: 270 [P.O.:270] Out: 1000 [Urine:1000] Intake/Output this shift: Total I/O In: 360 [P.O.:360] Out: -   Lab Results: Recent Labs    02/13/19 2307 02/14/19 0632 02/15/19 0324  WBC 8.3 8.5 5.3  HGB 13.5 11.4* 12.0*  HCT Unable to determine due to a cold agglutinin RESULTS UNAVAILABLE DUE TO INTERFERING SUBSTANCE RESULTS UNAVAILABLE DUE TO INTERFERING SUBSTANCE  PLT 44* 42* 41*   BMET Recent Labs    02/13/19 2307 02/14/19 0632 02/15/19 0324  NA 137 135 135  K 3.5 3.8 3.8  CL 97* 98 101  CO2 18* 23 23  GLUCOSE 93 118* 97  BUN 11 11 9   CREATININE 1.01 0.93 0.99  CALCIUM 8.5* 7.8* 8.0*   LFT Recent Labs    02/14/19 0632 02/15/19 0324  PROT 7.7 7.1  ALBUMIN 2.4* 2.1*  AST 478* 384*  ALT 129* 107*  ALKPHOS 128* 124  BILITOT 7.7* 6.8*  BILIDIR 3.6*  --   IBILI 4.1*  --    PT/INR Recent Labs    02/14/19 0632 02/15/19 0324  LABPROT 24.0*  25.0*  INR 2.2* 2.3*    Studies/Results: US Abdomen Limited Ruq  Result Date: 02/14/2019 CLINICAL DATA:  Abdominal distension, elevated liver function tests. EXAM: ULTRASOUND ABDOMEN LIMITED RIGHT UPPER QUADRANT COMPARISON:  Ultrasound of November 30, 2014. FINDINGS: Gallbladder: No cholelithiasis is noted, but mild gallbladder wall thickening is noted measured at 5 mm. No sonographic Murphy's sign is noted. Common bile duct: Diameter: 6 mm which is within normal limits. Liver: Increased echogenicity of hepatic parenchyma is noted suggesting hepatic steatosis or other diffuse hepatocellular disease. Mild surrounding ascites is noted. There may be recanalization of the umbilical vein suggesting hepatic cirrhosis. Portal vein is patent on color Doppler imaging with normal direction of blood flow towards the liver. IMPRESSION: Increased echogenicity of hepatic parenchyma is noted suggesting hepatic steatosis or other diffuse hepatocellular disease. There is possible recanalization of the umbilical vein suggesting hepatic cirrhosis. Mild ascites is noted. Mild gallbladder wall thickening is noted which most likely is due to adjacent hepatocellular disease. No cholelithiasis is noted. Electronically Signed   By: Lupita Raider M.D.   On: 02/14/2019 08:39       Assessment / Plan:    #1 48 yo AA male with hx of chronic ETOH abuse, decompensated cirrhosis with new c/o abdominal distention, intermittent dark stools- no active bleeding since admit  Pt has significant  ETOH hepatitis with DF score of 53- started prednisolone 40 mg daily yesterday with plan for one month course. Lft's improved  MELD 23  Mild ascites on US - paracentesis  Primarily for diagnostic purposes - pending  No peripheral edema- don't think he needs diuretics at present , must stay on 2 gm sodium diet.  Has not had EGD for variceal screening- will complete while here  #2 ETOH withdrawal - in CIWA - doing well #3 HTN  Plan;   Paracentesis today Have scheduled for EGD with Dr Barron Alvineirigliano  in am (10am ). Procedure discussed in detail with pt including indications, risks, benefits ans he is agreeable to proceed.  Vit K added for coagulopathy  If parameters stable/improved tomorrow may be able to d/c  In pm  He will need office follow up /labs  With GI - we will arrange  .         Principal Problem:   Ascites Active Problems:   HTN (hypertension)   Alcohol withdrawal (HCC)   High anion gap metabolic acidosis   Thrombocytopenia (HCC)   Prolonged Q-T interval on ECG   Elevated liver enzymes   Jaundice     LOS: 0 days   Amy Esterwood  02/15/2019, 11:38 AM

## 2019-02-15 NOTE — Progress Notes (Signed)
IR requested by Sondra Come, PA-C for possible image-guided paracentesis.  Limited abdominal ultrasound revealed no fluid that could be safely accessed with procedure today. Images sent to Dr. Miles Costain for review. Informed patient that procedure will not occur today. Dr. Rhona Leavens made aware.  IR available in future if needed.   Waylan Boga Louk, PA-C 02/15/2019, 1:19 PM

## 2019-02-16 ENCOUNTER — Encounter (HOSPITAL_COMMUNITY)
Admission: EM | Disposition: A | Discharge status: Home / Self Care | Payer: Self-pay | Source: Home / Self Care | Attending: Internal Medicine

## 2019-02-16 ENCOUNTER — Inpatient Hospital Stay (HOSPITAL_COMMUNITY): Payer: Self-pay | Admitting: Certified Registered"

## 2019-02-16 ENCOUNTER — Other Ambulatory Visit: Payer: Self-pay | Admitting: Internal Medicine

## 2019-02-16 ENCOUNTER — Encounter (HOSPITAL_COMMUNITY): Payer: Self-pay

## 2019-02-16 DIAGNOSIS — K921 Melena: Secondary | ICD-10-CM

## 2019-02-16 DIAGNOSIS — I8510 Secondary esophageal varices without bleeding: Secondary | ICD-10-CM

## 2019-02-16 DIAGNOSIS — K766 Portal hypertension: Secondary | ICD-10-CM

## 2019-02-16 DIAGNOSIS — K3189 Other diseases of stomach and duodenum: Secondary | ICD-10-CM

## 2019-02-16 HISTORY — PX: BIOPSY: SHX5522

## 2019-02-16 HISTORY — PX: ESOPHAGOGASTRODUODENOSCOPY (EGD) WITH PROPOFOL: SHX5813

## 2019-02-16 LAB — COMPREHENSIVE METABOLIC PANEL
ALT: 99 U/L — ABNORMAL HIGH (ref 0–44)
AST: 311 U/L — ABNORMAL HIGH (ref 15–41)
Albumin: 2.2 g/dL — ABNORMAL LOW (ref 3.5–5.0)
Alkaline Phosphatase: 129 U/L — ABNORMAL HIGH (ref 38–126)
Anion gap: 9 (ref 5–15)
BUN: 8 mg/dL (ref 6–20)
CO2: 21 mmol/L — ABNORMAL LOW (ref 22–32)
Calcium: 8.1 mg/dL — ABNORMAL LOW (ref 8.9–10.3)
Chloride: 105 mmol/L (ref 98–111)
Creatinine, Ser: 0.91 mg/dL (ref 0.61–1.24)
GFR calc Af Amer: >60 mL/min (ref >60)
GFR calc non Af Amer: >60 mL/min (ref >60)
Glucose, Bld: 81 mg/dL (ref 70–99)
Potassium: 3.8 mmol/L (ref 3.5–5.1)
Sodium: 135 mmol/L (ref 135–145)
Total Bilirubin: 7.4 mg/dL — ABNORMAL HIGH (ref 0.3–1.2)
Total Protein: 7.8 g/dL (ref 6.5–8.1)

## 2019-02-16 LAB — CBC
Hemoglobin: 12.6 g/dL — ABNORMAL LOW (ref 13.0–17.0)
Platelets: 54 10*3/uL — ABNORMAL LOW (ref 150–400)
WBC: 7.8 10*3/uL (ref 4.0–10.5)

## 2019-02-16 SURGERY — ESOPHAGOGASTRODUODENOSCOPY (EGD) WITH PROPOFOL
Anesthesia: General

## 2019-02-16 MED ORDER — PROPOFOL 10 MG/ML IV BOLUS
INTRAVENOUS | Status: DC | PRN
  Administered 2019-02-16: 250 mg via INTRAVENOUS

## 2019-02-16 MED ORDER — PANTOPRAZOLE SODIUM 40 MG PO TBEC: 40.0000 mg | DELAYED_RELEASE_TABLET | Freq: Two times a day (BID) | ORAL | 0 refills | Status: AC

## 2019-02-16 MED ORDER — LACTATED RINGERS IV SOLN
INTRAVENOUS | Status: DC
  Administered 2019-02-16: 09:00:00 via INTRAVENOUS

## 2019-02-16 MED ORDER — LACTATED RINGERS IV SOLN
INTRAVENOUS | Status: DC | PRN
  Administered 2019-02-16: 11:00:00 via INTRAVENOUS

## 2019-02-16 MED ORDER — METOPROLOL TARTRATE 25 MG PO TABS: 25.0000 mg | ORAL_TABLET | Freq: Two times a day (BID) | ORAL | 0 refills | Status: DC

## 2019-02-16 MED ORDER — ONDANSETRON HCL 4 MG/2ML IJ SOLN
INTRAMUSCULAR | Status: DC | PRN
  Administered 2019-02-16: 4 mg via INTRAVENOUS

## 2019-02-16 MED ORDER — DEXAMETHASONE SODIUM PHOSPHATE 10 MG/ML IJ SOLN
INTRAMUSCULAR | Status: DC | PRN
  Administered 2019-02-16: 8 mg via INTRAVENOUS

## 2019-02-16 MED ORDER — LIDOCAINE 2% (20 MG/ML) 5 ML SYRINGE
INTRAMUSCULAR | Status: DC | PRN
  Administered 2019-02-16: 100 mg via INTRAVENOUS

## 2019-02-16 MED ORDER — PREDNISOLONE 5 MG PO TABS
40.0000 mg | ORAL_TABLET | Freq: Every day | ORAL | Status: DC
  Filled 2019-02-16: qty 8

## 2019-02-16 MED ORDER — PANTOPRAZOLE SODIUM 40 MG PO TBEC
40.0000 mg | DELAYED_RELEASE_TABLET | Freq: Two times a day (BID) | ORAL | Status: DC
  Administered 2019-02-16: 40 mg via ORAL
  Filled 2019-02-16: qty 1

## 2019-02-16 MED ORDER — SUCCINYLCHOLINE CHLORIDE 200 MG/10ML IV SOSY
PREFILLED_SYRINGE | INTRAVENOUS | Status: DC | PRN
  Administered 2019-02-16: 100 mg via INTRAVENOUS

## 2019-02-16 MED FILL — PANTOPRAZOLE SOD DR 40 MG T: 40 | 56 days supply | Qty: 112 | Fill #0

## 2019-02-16 SURGICAL SUPPLY — 15 items

## 2019-02-16 NOTE — Interval H&P Note (Signed)
History and Physical Interval Note:  02/16/2019 10:06 AM  Barry Harris  has presented today for surgery, with the diagnosis of Varices screening.  The various methods of treatment have been discussed with the patient and family. After consideration of risks, benefits and other options for treatment, the patient has consented to  Procedure(s): ESOPHAGOGASTRODUODENOSCOPY (EGD) WITH PROPOFOL (N/A) as a surgical intervention.  The patient's history has been reviewed, patient examined, no change in status, stable for surgery.  I have reviewed the patient's chart and labs.  Questions were answered to the patient's satisfaction.     Verlin Dike [name]

## 2019-02-16 NOTE — Anesthesia Preprocedure Evaluation (Addendum)
Anesthesia Evaluation  Patient identified by MRN, date of birth, ID band Patient awake    Reviewed: Allergy & Precautions, H&P , NPO status , Patient's Chart, lab work & pertinent test results, reviewed documented beta blocker date and time   Airway Mallampati: II  TM Distance: >3 FB Neck ROM: Full    Dental no notable dental hx. (+) Teeth Intact, Dental Advisory Given   Pulmonary neg pulmonary ROS, former smoker,    Pulmonary exam normal breath sounds clear to auscultation       Cardiovascular hypertension, Pt. on medications and Pt. on home beta blockers  Rhythm:Regular Rate:Normal     Neuro/Psych Anxiety negative neurological ROS     GI/Hepatic negative GI ROS, (+)     substance abuse  alcohol use,   Endo/Other  negative endocrine ROS  Renal/GU negative Renal ROS  negative genitourinary   Musculoskeletal   Abdominal   Peds  Hematology negative hematology ROS (+)   Anesthesia Other Findings   Reproductive/Obstetrics negative OB ROS                            Anesthesia Physical Anesthesia Plan  ASA: III  Anesthesia Plan: General   Post-op Pain Management:    Induction: Intravenous  PONV Risk Score and Plan: 2 and Ondansetron, Dexamethasone and Midazolam  Airway Management Planned: Oral ETT  Additional Equipment:   Intra-op Plan:   Post-operative Plan: Extubation in OR  Informed Consent: I have reviewed the patients History and Physical, chart, labs and discussed the procedure including the risks, benefits and alternatives for the proposed anesthesia with the patient or authorized representative who has indicated his/her understanding and acceptance.     Dental advisory given  Plan Discussed with: CRNA  Anesthesia Plan Comments:         Anesthesia Quick Evaluation

## 2019-02-16 NOTE — Op Note (Signed)
[facility] Patient Name: Barry Harris Procedure Date : 02/16/2019 MRN: [id number] Attending MD: Doristine Locks , MD Date of Birth: 1971-06-06 CSN: [id number] Age: 48 Admit Type: Inpatient Procedure:                Upper GI endoscopy Indications:              Melena, Cirrhosis rule out esophageal varices. MELD Providers:                Doristine Locks, MD, Vicki Mallet, RN, Kandice Robinsons, Technician Referring MD:              Medicines:                Monitored Anesthesia Care Complications:            No immediate complications. Estimated Blood Loss:     Estimated blood loss was minimal. Procedure:                Pre-Anesthesia Assessment:                           - Prior to the procedure, a History and Physical                            was performed, and patient medications and                            allergies were reviewed. The patient's tolerance of                            previous anesthesia was also reviewed. The risks                            and benefits of the procedure and the sedation                            options and risks were discussed with the patient.                            All questions were answered, and informed consent                            was obtained. Prior Anticoagulants: The patient has                            taken no previous anticoagulant or antiplatelet                            agents. ASA Grade Assessment: III - A patient with                            severe systemic disease. After reviewing the risks  and benefits, the patient was deemed in                            satisfactory condition to undergo the procedure.                           After obtaining informed consent, the endoscope was                            passed under direct vision. Throughout the                            procedure, the patient's blood pressure, pulse, and                             oxygen saturations were monitored continuously. The                            GIF-H190 ([id number]) Olympus gastroscope was                            introduced through the mouth, and advanced to the                            second part of duodenum. The upper GI endoscopy was                            accomplished without difficulty. The patient                            tolerated the procedure well. Scope In: Scope Out: Findings:      Grade I varices were found in the lower third of the esophagus. They       were 4 mm in largest diameter and deflated with air insufflation. No red       wale signs.      The Z-line was regular and was found 42 cm from the incisors.      The upper third of the esophagus was normal.      Moderate portal hypertensive gastropathy was found in the entire       examined stomach. Biopsies were taken with a cold forceps for       Helicobacter pylori testing. Estimated blood loss was minimal.      The duodenal bulb, first portion of the duodenum and second portion of       the duodenum were normal. Impression:               - Grade I esophageal varices.                           - Z-line regular, 42 cm from the incisors.                           - Normal upper third of esophagus.                           -  Portal hypertensive gastropathy. Biopsied.                           - Normal duodenal bulb, first portion of the                            duodenum and second portion of the duodenum. Recommendation:           - Return patient to hospital ward for ongoing care.                           - Full liquid diet today.                           - Continue present medications.                           - Use Protonix (pantoprazole) 40 mg PO BID for 8                            weeks.                           - Start Nadolol 40 mg daily and titrate to goal HR                            55-60.                           - Await pathology  results.                           - Return to GI clinic at appointment to be                            scheduled. Procedure Code(s):        --- Professional ---                           [phone], Esophagogastroduodenoscopy, flexible,                            transoral; with biopsy, single or multiple Diagnosis Code(s):        --- Professional ---                           K74.60, Unspecified cirrhosis of liver                           I85.10, Secondary esophageal varices without                            bleeding                           K76.6, Portal hypertension  K31.89, Other diseases of stomach and duodenum                           K92.1, Melena (includes Hematochezia) CPT copyright 2019 [employer]. All rights reserved. The codes documented in this report are preliminary and upon coder review may  be revised to meet current compliance requirements. Doristine LocksVito Jaylan Duggar, MD 02/16/2019 11:49:02 AM Number of Addenda: 0

## 2019-02-16 NOTE — Progress Notes (Signed)
Pt discharged home per order. IV and telemetry box removed. Pt received discharge instructions and all questions were answered. Pt received protonix from [facility] transitional care pharmacy. Pt requested to have Lopressor prescription sent to his pharmacy, as he does not have any refills. Pt discharged with all belongings. Pt left via wheelchair and was accompanied by a Ferne Coe.   Ardeen Jourdain BSN, RN

## 2019-02-16 NOTE — Transfer of Care (Signed)
Immediate Anesthesia Transfer of Care Note  Patient: Barry Harris  Procedure(s) Performed: ESOPHAGOGASTRODUODENOSCOPY (EGD) WITH PROPOFOL (N/A ) BIOPSY  Patient Location: Endoscopy Unit  Anesthesia Type:General  Level of Consciousness: awake and patient cooperative  Airway & Oxygen Therapy: Patient Spontanous Breathing and Patient connected to face mask oxygen  Post-op Assessment: Report given to RN, Post -op Vital signs reviewed and stable and Patient moving all extremities X 4  Post vital signs: Reviewed and stable  Last Vitals:  Vitals Value Taken Time  BP 132/73 02/16/2019 11:39 AM  Temp    Pulse    Resp 24 02/16/2019 11:39 AM  SpO2 99 % 02/16/2019 11:39 AM    Last Pain:  Vitals:   02/16/19 1139  TempSrc: Oral  PainSc: 0-No pain         Complications: No apparent anesthesia complications

## 2019-02-16 NOTE — Anesthesia Procedure Notes (Signed)
Procedure Name: Intubation Date/Time: 02/16/2019 11:19 AM Performed by: Julian Reil, CRNA Pre-anesthesia Checklist: Patient identified, Emergency Drugs available, Suction available and Patient being monitored Patient Re-evaluated:Patient Re-evaluated prior to induction Oxygen Delivery Method: Circle system utilized Preoxygenation: Pre-oxygenation with 100% oxygen Induction Type: IV induction and Rapid sequence Laryngoscope Size: Miller and 3 Grade View: Grade I Tube type: Oral Tube size: 7.5 mm Number of attempts: 1 Airway Equipment and Method: Stylet Placement Confirmation: ETT inserted through vocal cords under direct vision,  positive ETCO2 and breath sounds checked- equal and bilateral Secured at: 22 cm Tube secured with: Tape Dental Injury: Teeth and Oropharynx as per pre-operative assessment  Comments: RSI due to Covid-19 pandemic concerns.

## 2019-02-16 NOTE — Anesthesia Postprocedure Evaluation (Signed)
Anesthesia Post Note  Patient: Alfredo Martinez  Procedure(s) Performed: ESOPHAGOGASTRODUODENOSCOPY (EGD) WITH PROPOFOL (N/A ) BIOPSY     Patient location during evaluation: Endoscopy Anesthesia Type: General Level of consciousness: awake and alert Pain management: pain level controlled Vital Signs Assessment: post-procedure vital signs reviewed and stable Respiratory status: spontaneous breathing, nonlabored ventilation and respiratory function stable Cardiovascular status: blood pressure returned to baseline and stable Postop Assessment: no apparent nausea or vomiting Anesthetic complications: no    Last Vitals:  Vitals:   02/16/19 1154 02/16/19 1229  BP: 127/79 (!) 141/92  Pulse:  81  Resp: (!) 21 19  Temp:  36.4 C  SpO2: 96% 100%    Last Pain:  Vitals:   02/16/19 1229  TempSrc: Oral  PainSc:                  Soffia Doshier,W. EDMOND

## 2019-02-16 NOTE — Discharge Summary (Signed)
Physician Discharge Summary  Barry Harris ZOX:[id number] DOB: 03/06/1971 DOA: 02/13/2019  PCP: Shirline Frees, NP  Admit date: 02/13/2019 Discharge date: 02/16/2019  Admitted From: Home Disposition:  Home  Recommendations for Outpatient Follow-up:  1. Follow up with PCP in 3-4 weeks 2. Follow up with GI as scheduled  Discharge Condition:Stable CODE STATUS:Full Diet recommendation: Full liquid at time of discharge, advance as tolerated   Brief/Interim Summary: 48 y.o.malewith a Past Medical History of alcoholic cirrhosis, previous serological work-up negative for hepatitis and autoimmune causes. Continues to drink about a Pint a day, who presented with postprandial bloating sensation and unable to eat as well as abdominal distention.  Discharge Diagnoses:  Principal Problem:   Ascites Active Problems:   HTN (hypertension)   Alcohol withdrawal (HCC)   High anion gap metabolic acidosis   Thrombocytopenia (HCC)   Prolonged Q-T interval on ECG   Elevated liver enzymes   Jaundice   Secondary esophageal varices without bleeding (HCC)   Portal hypertensive gastropathy (HCC)  Ascitessecondary to suspected alcoholic liver cirrhosis -Patient with history of alcohol dependence. -Abdominal ultrasound with features suggesting cirrhosis with mild ascites -Attempted IR guided paracentesis 5/6, unsuccessful -Repeat LFTs are trending down -continue -Dietary sodium restriction -Patient reports history of intermittent melena and hematochezia. Hemoglobin 13.5. -GI consulted, underwent EGD on 5/7 with findings of grade 1 varices with rec for cont beta blocker and 8 weeks of PPI -GI to follow up as outpatient  Alcohol withdrawal -Patient has a history of alcohol dependence.Last drink 2 days prior to visit.Tachycardic and blood pressure elevated. Appeared tremulous. -On Librium 25 mg every 8 hours -CIWA protocol; Ativan PRN -Thiamine, folate, multivitamin -Withdrawal symptoms  resolved  High anion gap metabolic acidosis Bicarb 18, anion gap 22.Likelysecondary to history ofalcohol use.  Hypertension Blood pressure elevated in the setting of alcohol withdrawal. -Continue with hydralazine PRN -Management of alcohol withdrawal as mentioned above  Chronic thrombocytopenia -Platelet count 44 at time of presentation -Plts stable. Suspect related to alcohol use andliver cirrhosis.No signs of active bleeding at this time. -No anticoagulation for DVT prophylaxis. SCDs at this time.  QTC prolongation on EKG -stable   Discharge Instructions   Allergies as of 02/16/2019   No Known Allergies     Medication List    TAKE these medications   metoprolol tartrate 25 MG tablet Commonly known as:  LOPRESSOR TAKE 1 TABLET BY MOUTH TWICE A DAY   pantoprazole 40 MG tablet Commonly known as:  PROTONIX Take 1 tablet (40 mg total) by mouth 2 (two) times daily.      Follow-up Information    Shirline Frees, NP. Schedule an appointment as soon as possible for a visit in 3 day(s).   Specialty:  Family Medicine Contact information: [address] [place] [address] [phone]        Doristine Locks V, DO Follow up.   Specialty:  Gastroenterology Why:  as scheduled Contact information: [address] [place] [address] [phone]          No Known Allergies  Consultations:  GI  Procedures/Studies: Ir Abdomen US Limited  Result Date: 02/15/2019 CLINICAL DATA:  48 year old male with abdominal distension for the past 3 days, history of alcohol abuse and elevated LFTs. Evaluate for ascites. EXAM: LIMITED ABDOMEN ULTRASOUND FOR ASCITES TECHNIQUE: Limited ultrasound survey for ascites was performed in all four abdominal quadrants. COMPARISON:  Right upper quadrant ultrasound performed yesterday 02/14/2019 FINDINGS: Sonographic interrogation of the 4 quadrants of the  abdomen demonstrates no significant ascites.  IMPRESSION: Negative for ascites. Electronically Signed   By: Malachy MoanHeath  McCullough M.D.   On: 02/15/2019 13:37   [place] Abdomen Limited Ruq  Result Date: 02/14/2019 CLINICAL DATA:  Abdominal distension, elevated liver function tests. EXAM: ULTRASOUND ABDOMEN LIMITED RIGHT UPPER QUADRANT COMPARISON:  Ultrasound of November 30, 2014. FINDINGS: Gallbladder: No cholelithiasis is noted, but mild gallbladder wall thickening is noted measured at 5 mm. No sonographic Murphy's sign is noted. Common bile duct: Diameter: 6 mm which is within normal limits. Liver: Increased echogenicity of hepatic parenchyma is noted suggesting hepatic steatosis or other diffuse hepatocellular disease. Mild surrounding ascites is noted. There may be recanalization of the umbilical vein suggesting hepatic cirrhosis. Portal vein is patent on color Doppler imaging with normal direction of blood flow towards the liver. IMPRESSION: Increased echogenicity of hepatic parenchyma is noted suggesting hepatic steatosis or other diffuse hepatocellular disease. There is possible recanalization of the umbilical vein suggesting hepatic cirrhosis. Mild ascites is noted. Mild gallbladder wall thickening is noted which most likely is due to adjacent hepatocellular disease. No cholelithiasis is noted. Electronically Signed   By: Lupita RaiderJames  Green Jr M.D.   On: 02/14/2019 08:39     Subjective: Eager to go home  Discharge Exam: Vitals:   02/16/19 1154 02/16/19 1229  BP: 127/79 (!) 141/92  Pulse:  81  Resp: (!) 21 19  Temp:  97.6 F (36.4 C)  SpO2: 96% 100%   Vitals:   02/16/19 1000 02/16/19 1139 02/16/19 1154 02/16/19 1229  BP: (!) 141/92 132/73 127/79 (!) 141/92  Pulse: 85   81  Resp:  (!) 24 (!) 21 19  Temp:  97.8 F (36.6 C)  97.6 F (36.4 C)  TempSrc:  Oral  Oral  SpO2:  99% 96% 100%  Weight:      Height:        General: Pt is alert, awake, not in acute distress Cardiovascular: RRR, S1/S2 +, no rubs, no gallops Respiratory: CTA  bilaterally, no wheezing, no rhonchi Abdominal: Soft, NT, ND, bowel sounds + Extremities: no edema, no cyanosis   The results of significant diagnostics from this hospitalization (including imaging, microbiology, ancillary and laboratory) are listed below for reference.     Microbiology: No results found for this or any previous visit (from the past 240 hour(s)).   Labs: BNP (last 3 results) No results for input(s): BNP in the last 8760 hours. Basic Metabolic Panel: Recent Labs  Lab 02/13/19 2307 02/14/19 0223 02/14/19 0632 02/14/19 1521 02/15/19 0324 02/16/19 0452  NA 137  --  135  --  135 135  K 3.5  --  3.8  --  3.8 3.8  CL 97*  --  98  --  101 105  CO2 18*  --  23  --  23 21*  GLUCOSE 93  --  118*  --  97 81  BUN 11  --  11  --  9 8  CREATININE 1.01  --  0.93  --  0.99 0.91  CALCIUM 8.5*  --  7.8*  --  8.0* 8.1*  MG  --  2.1  --   --  1.9  --   PHOS  --   --   --  2.3*  --   --    Liver Function Tests: Recent Labs  Lab 02/13/19 2307 02/14/19 0632 02/15/19 0324 02/16/19 0452  AST 540* 478* 384* 311*  ALT 152* 129* 107* 99*  ALKPHOS 159* 128* 124  129*  BILITOT 8.4* 7.7* 6.8* 7.4*  PROT 9.0* 7.7 7.1 7.8  ALBUMIN 2.9* 2.4* 2.1* 2.2*   Recent Labs  Lab 02/13/19 2307  LIPASE 59*   Recent Labs  Lab 02/14/19 1036  AMMONIA 31   CBC: Recent Labs  Lab 02/13/19 2307 02/14/19 0632 02/15/19 0324 02/16/19 0452  WBC 8.3 8.5 5.3 7.8  HGB 13.5 11.4* 12.0* 12.6*  HCT Unable to determine due to a cold agglutinin RESULTS UNAVAILABLE DUE TO INTERFERING SUBSTANCE RESULTS UNAVAILABLE DUE TO INTERFERING SUBSTANCE RESULTS UNAVAILABLE DUE TO INTERFERING SUBSTANCE  MCV Unable to determine due to a cold agglutinin RESULTS UNAVAILABLE DUE TO INTERFERING SUBSTANCE RESULTS UNAVAILABLE DUE TO INTERFERING SUBSTANCE RESULTS UNAVAILABLE DUE TO INTERFERING SUBSTANCE  PLT 44* 42* 41* 54*   Cardiac Enzymes: No results for input(s): CKTOTAL, CKMB, CKMBINDEX, TROPONINI in the last  168 hours. BNP: Invalid input(s): POCBNP CBG: No results for input(s): GLUCAP in the last 168 hours. D-Dimer No results for input(s): DDIMER in the last 72 hours. Hgb A1c No results for input(s): HGBA1C in the last 72 hours. Lipid Profile No results for input(s): CHOL, HDL, LDLCALC, TRIG, CHOLHDL, LDLDIRECT in the last 72 hours. Thyroid function studies No results for input(s): TSH, T4TOTAL, T3FREE, THYROIDAB in the last 72 hours.  Invalid input(s): FREET3 Anemia work up No results for input(s): VITAMINB12, FOLATE, FERRITIN, TIBC, IRON, RETICCTPCT in the last 72 hours. Urinalysis    Component Value Date/Time   COLORURINE AMBER (A) 02/14/2019 0222   APPEARANCEUR HAZY (A) 02/14/2019 0222   LABSPEC 1.020 02/14/2019 0222   PHURINE 5.0 02/14/2019 0222   GLUCOSEU NEGATIVE 02/14/2019 0222   HGBUR NEGATIVE 02/14/2019 0222   BILIRUBINUR MODERATE (A) 02/14/2019 0222   KETONESUR 20 (A) 02/14/2019 0222   PROTEINUR 30 (A) 02/14/2019 0222   NITRITE NEGATIVE 02/14/2019 0222   LEUKOCYTESUR NEGATIVE 02/14/2019 0222   Sepsis Labs Invalid input(s): PROCALCITONIN,  WBC,  LACTICIDVEN Microbiology No results found for this or any previous visit (from the past 240 hour(s)).  Time spent: 30 min  SIGNED:   Rickey Barbara, MD  [facility] 02/16/2019, 1:33 PM  If 7PM-7AM, please contact night-coverage

## 2019-02-16 NOTE — Progress Notes (Signed)
EGD completed and notable for Grade 1 esophageal varices and portal hypertensive gastropathy.   - Resume BB (already on Lopressor) - PPI BID - Will arrange f/u appt - GI service will sign off at this time. Please do not hesitate to contact with additional questions or concerns.

## 2019-02-17 NOTE — Telephone Encounter (Signed)
Barry Harris

## 2019-02-20 ENCOUNTER — Encounter (HOSPITAL_COMMUNITY): Payer: Self-pay | Admitting: Gastroenterology

## 2019-02-21 ENCOUNTER — Encounter: Payer: Self-pay | Admitting: Gastroenterology

## 2019-04-20 ENCOUNTER — Telehealth: Payer: Self-pay | Admitting: Internal Medicine

## 2019-05-13 NOTE — Telephone Encounter (Signed)
[facility]  Pt found DOA 4:15 am rigor mortis died 4-6 hrs prior found prone in bed last seen alive 11 pm by mom CPR attempted by family but EMS arrived at the house to find pt deceased of natural causes in rigor mortis time of death per EMS declared at 4:15 am   EMS had been called earlier to the house for anxiety hyperventilation but pt would not allow transport to the hospital prior to 11 pm on 04/19/19 and it is unknown currently if EKG was done  Death certificate needs to be signed by PCP

## 2019-05-13 DEATH — deceased

## 2020-01-09 IMAGING — US ULTRASOUND ABDOMEN LIMITED
1 series · 14 of 25 positions shown · non-contrast
Comparison: Ultrasound November 30, 2014.

CLINICAL DATA: Abdominal distension, elevated liver function tests.

EXAM:
ULTRASOUND ABDOMEN LIMITED RIGHT UPPER QUADRANT

[Series 1: ultrasound abdomen limited · 14 of 49 slices shown]
[im 1/49]
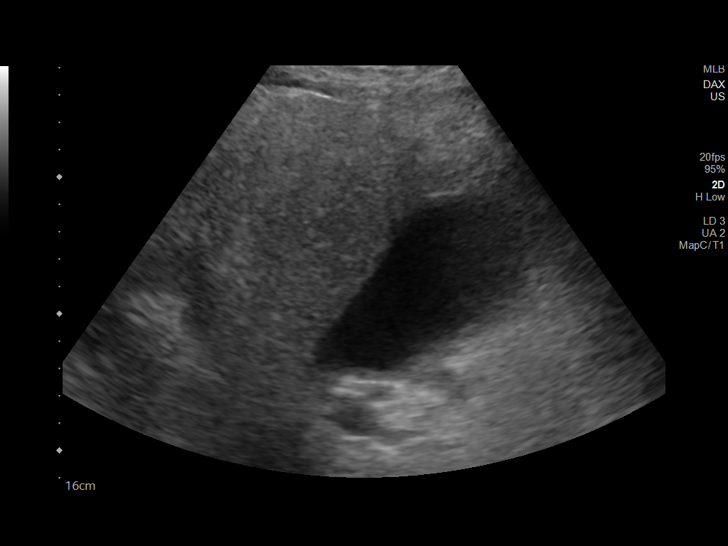
[im 5/49]
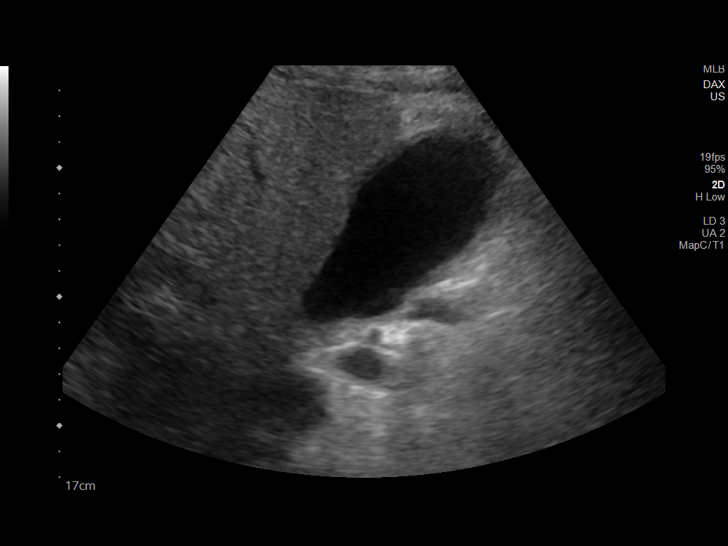
[im 9/49]
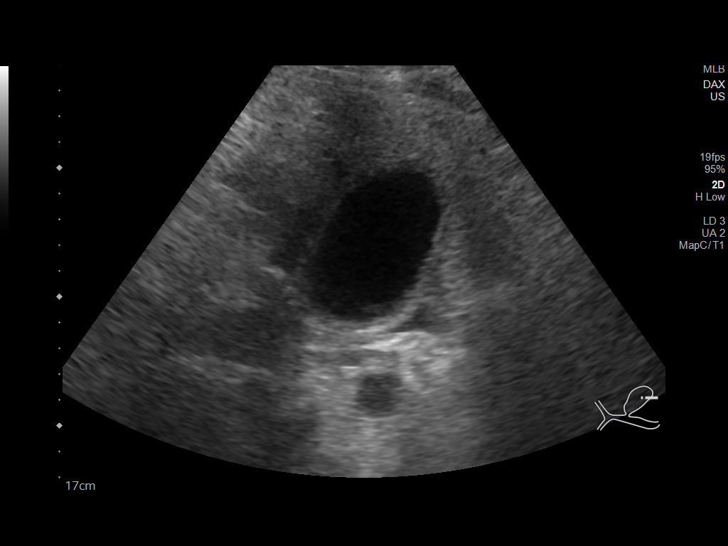
[im 13/49]
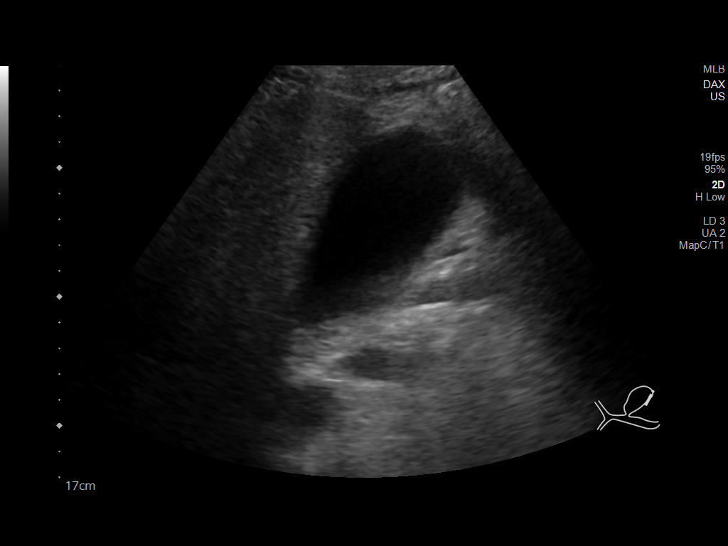
[im 17/49]
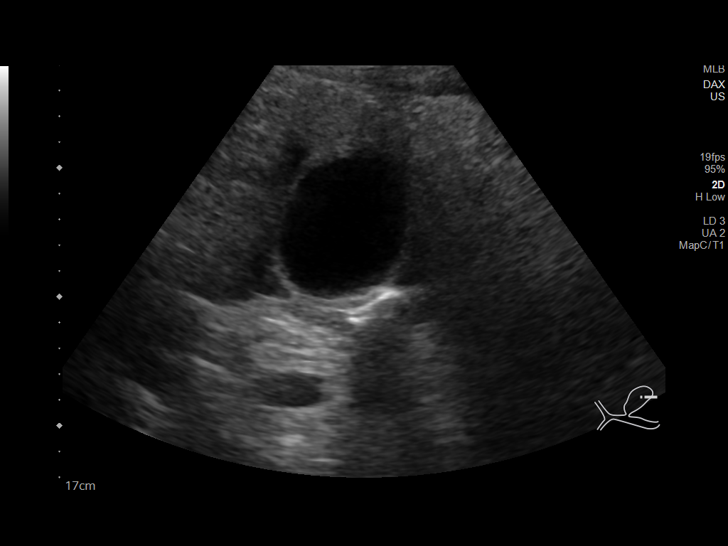
[im 19/49]
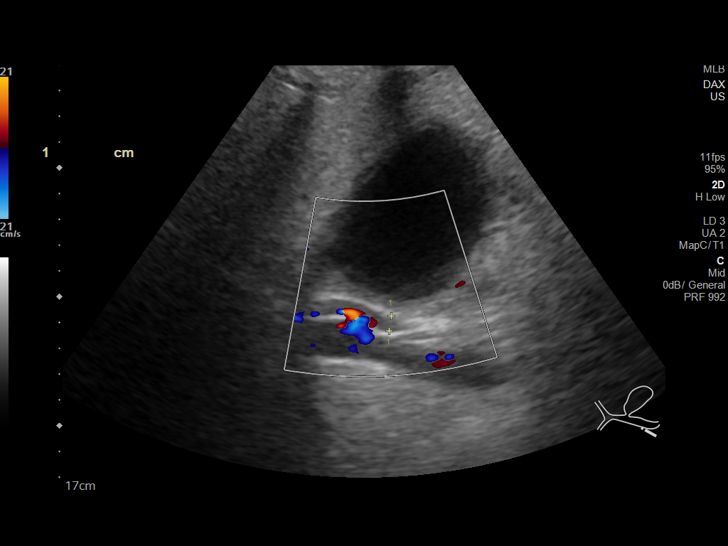
[im 23/49]
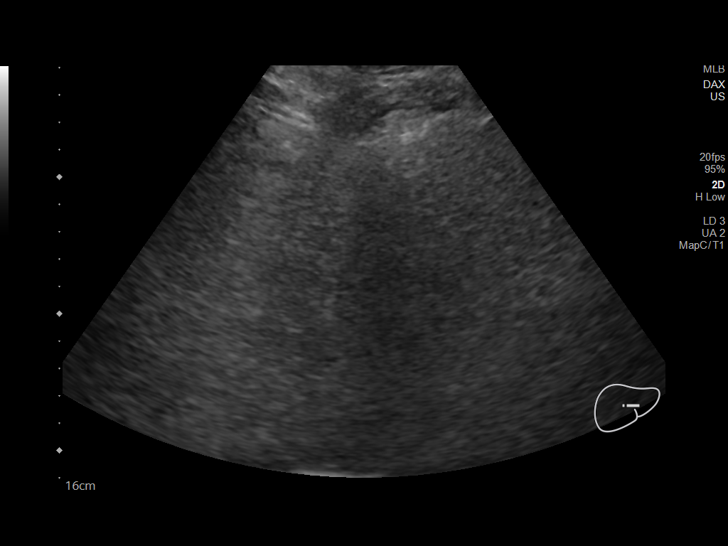
[im 27/49]
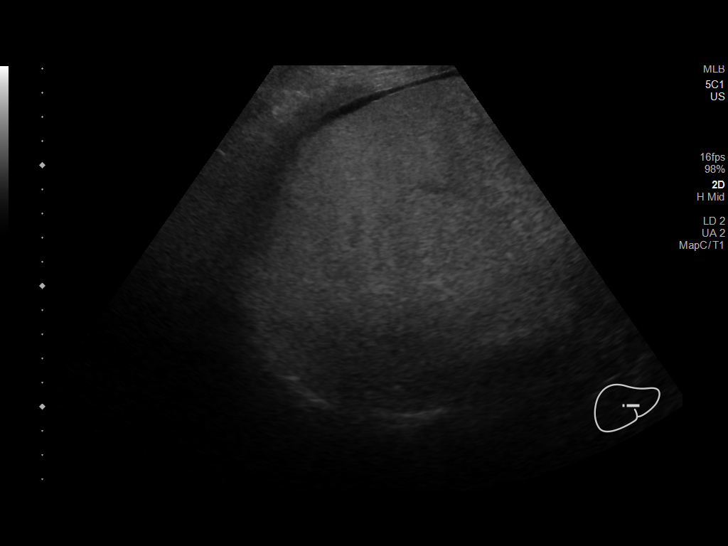
[im 31/49]
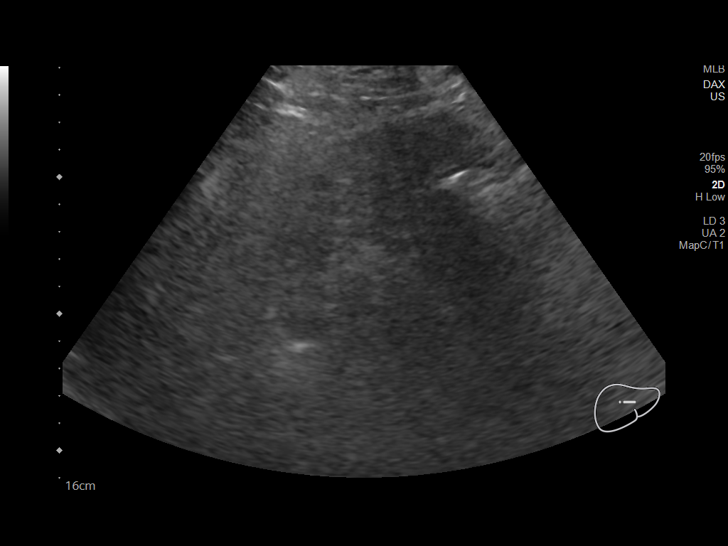
[im 33/49]
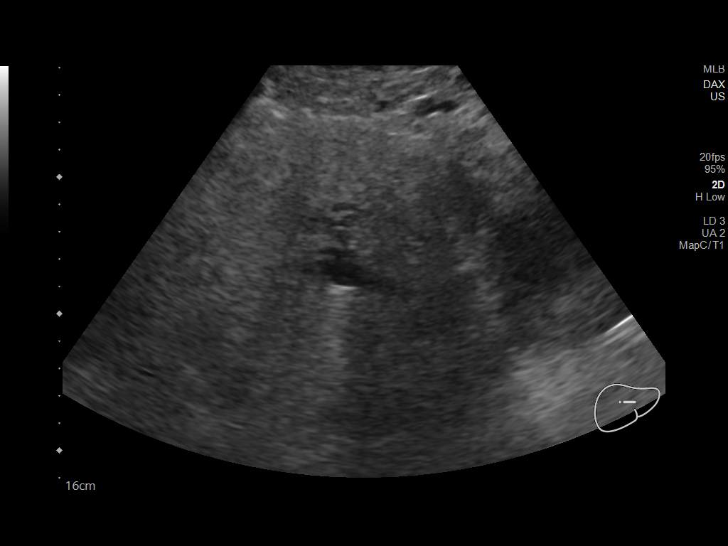
[im 37/49]
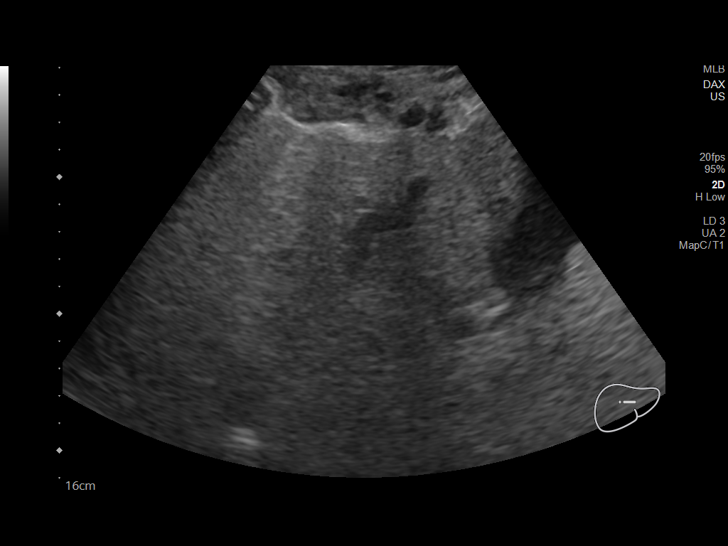
[im 41/49]
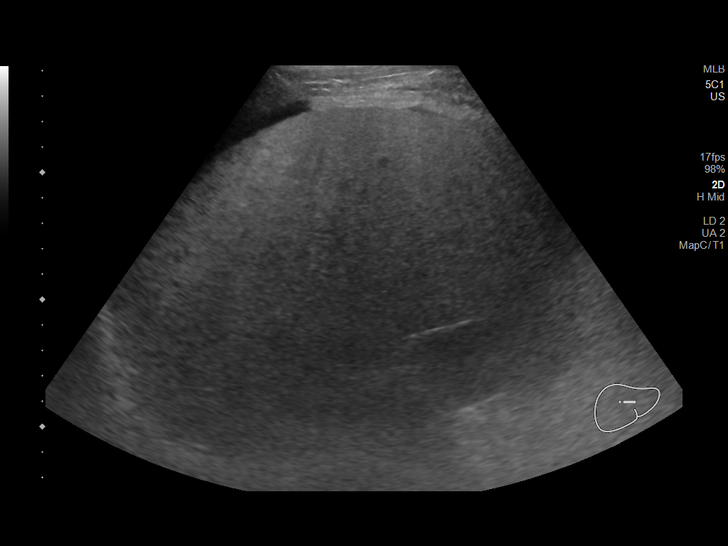
[im 45/49]
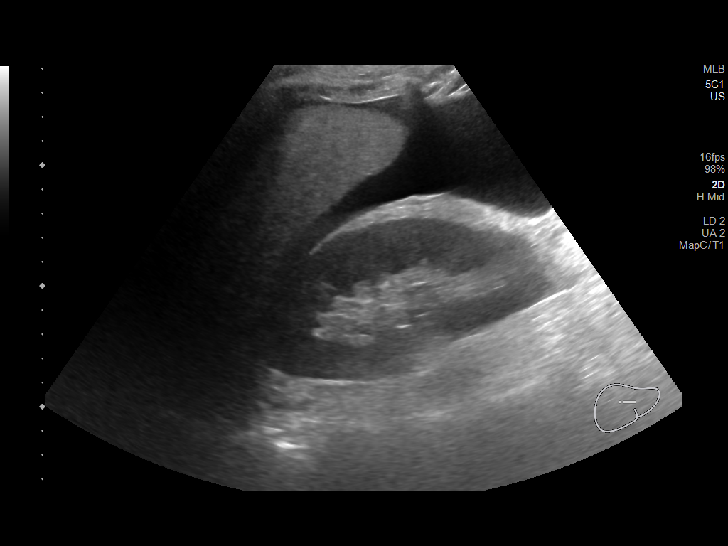
[im 49/49]
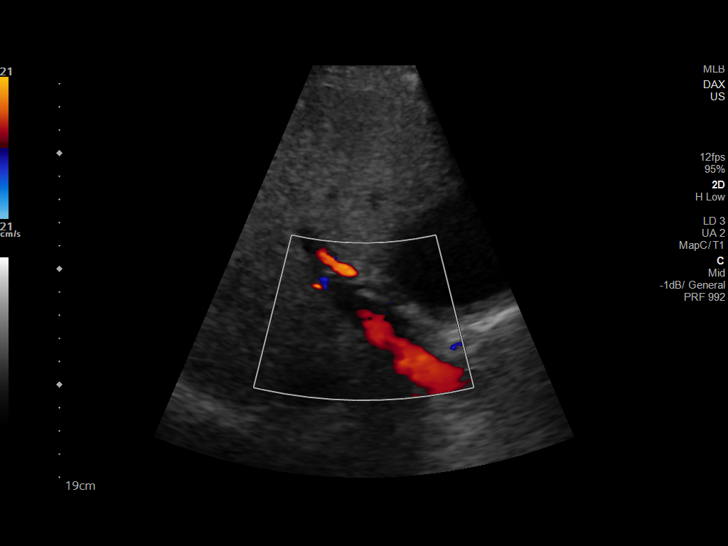

[14 of 25 positions shown; findings below may reference images not displayed]

FINDINGS: Gallbladder:

No cholelithiasis is noted, but mild gallbladder wall thickening is
noted measured at 5 mm. No sonographic Murphy's sign is noted.

Common bile duct:

Diameter: 6 mm which is within normal limits.

Liver:

Increased echogenicity of hepatic parenchyma is noted suggesting
hepatic steatosis or other diffuse hepatocellular disease. Mild
surrounding ascites is noted. There may be recanalization of the
umbilical vein suggesting hepatic cirrhosis. Portal vein is patent
on color Doppler imaging with normal direction of blood flow towards
the liver.
IMPRESSION: Increased echogenicity of hepatic parenchyma is noted suggesting
hepatic steatosis or other diffuse hepatocellular disease. There is
possible recanalization of the umbilical vein suggesting hepatic
cirrhosis. Mild ascites is noted. Mild gallbladder wall thickening
is noted which most likely is due to adjacent hepatocellular
disease. No cholelithiasis is noted.

## 2020-01-10 IMAGING — US ULTRASOUND ABDOMEN LIMITED
1 series · 4 of 4 positions shown · non-contrast
Comparison: Right upper quadrant ultrasound performed yesterday
02/14/2019

CLINICAL DATA: 47-year-old male with abdominal distension for the
past 3 days, history of alcohol abuse and elevated LFTs. Evaluate
for ascites.

EXAM:
LIMITED ABDOMEN ULTRASOUND FOR ASCITES
TECHNIQUE: Limited ultrasound survey for ascites was performed in all four
abdominal quadrants.

[Series 1: ir (id) (id)/(id)/(id) ir · 4 of 4 slices shown]
[im 1/4]
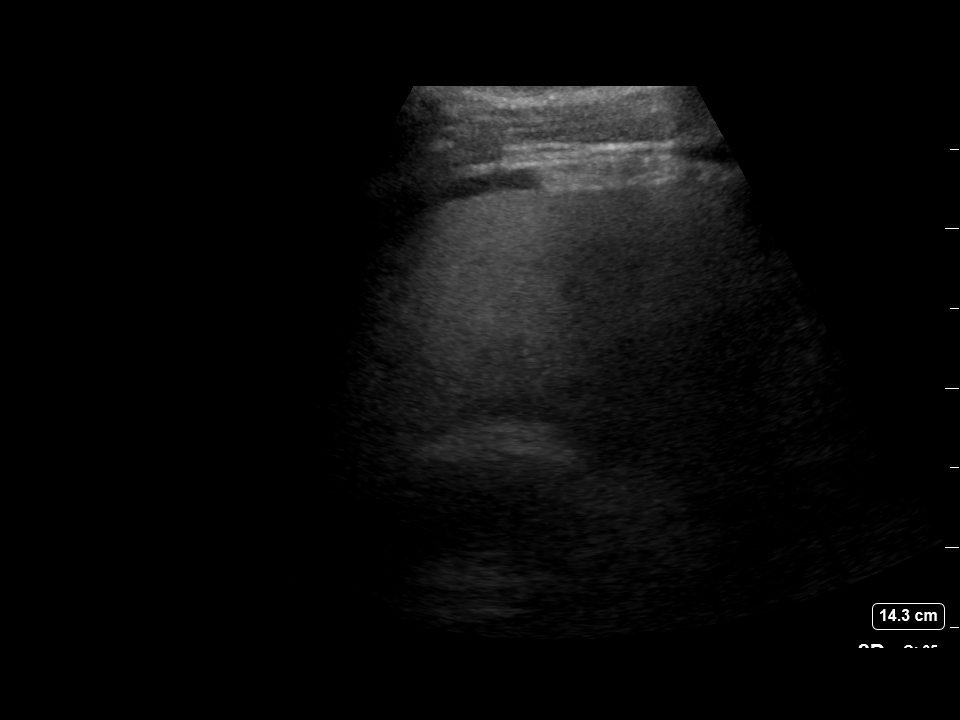
[im 2/4]
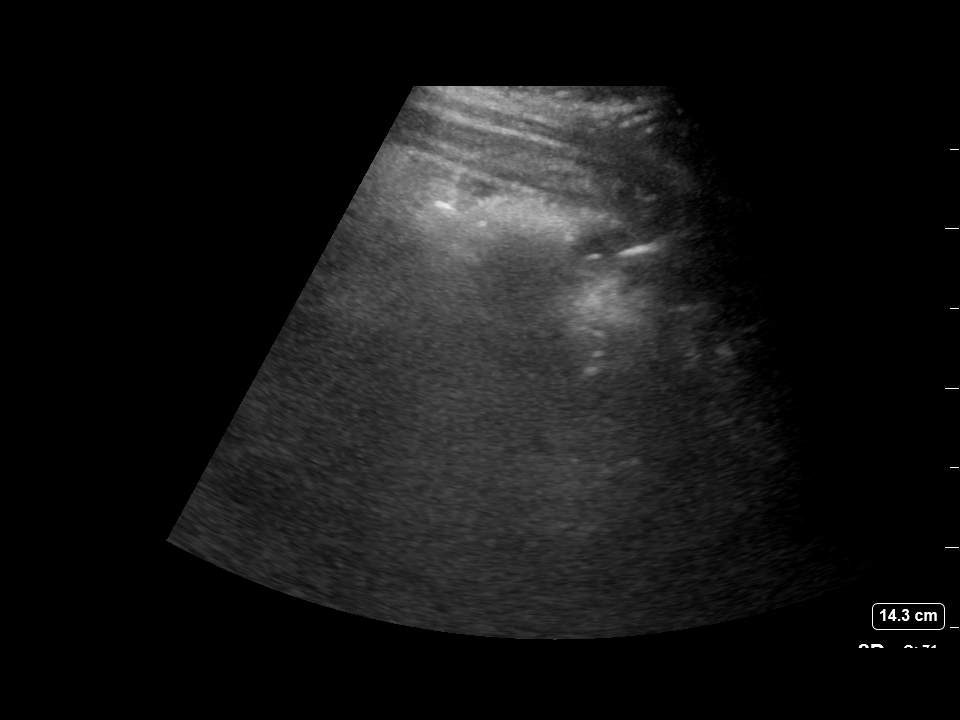
[im 3/4]
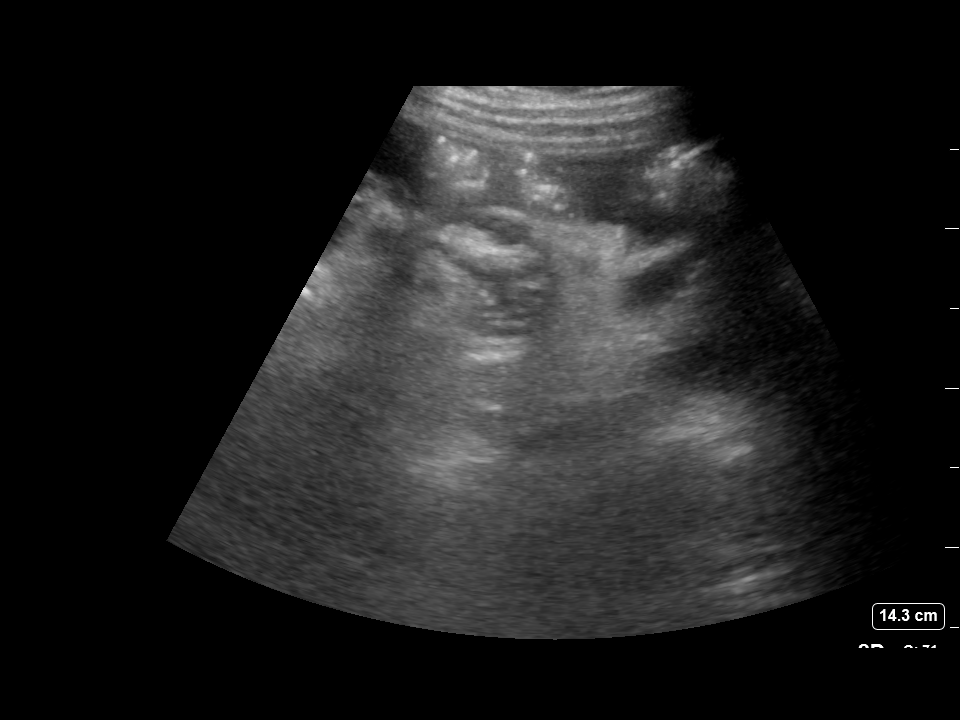
[im 4/4]
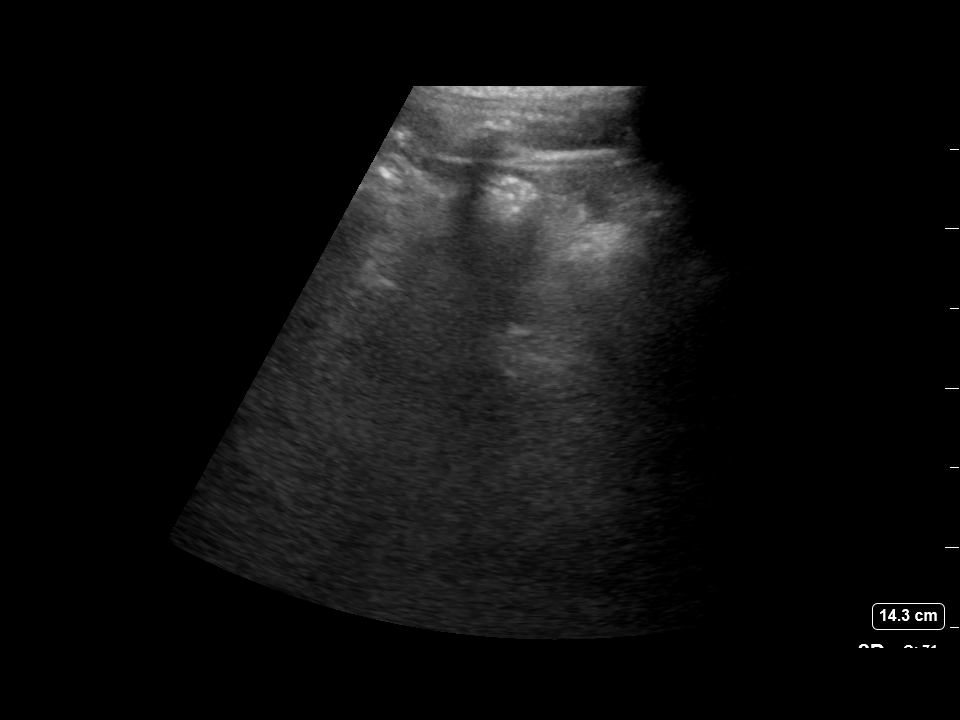

[4 of 4 positions shown; findings below may reference images not displayed]

FINDINGS: Sonographic interrogation of the 4 quadrants of the abdomen
demonstrates no significant ascites.
IMPRESSION: Negative for ascites.
